# Patient Record
Sex: Male | Born: 1965 | Race: Black or African American | Hispanic: No | Marital: Single
Health system: Midwestern US, Community
[De-identification: ages and names within clinical notes are randomized; demographics above are authoritative.]

## PROBLEM LIST (undated history)

## (undated) DIAGNOSIS — I10 Essential (primary) hypertension: Secondary | ICD-10-CM

## (undated) DIAGNOSIS — J449 Chronic obstructive pulmonary disease, unspecified: Secondary | ICD-10-CM

## (undated) DIAGNOSIS — E785 Hyperlipidemia, unspecified: Secondary | ICD-10-CM

## (undated) DIAGNOSIS — I739 Peripheral vascular disease, unspecified: Secondary | ICD-10-CM

## (undated) DIAGNOSIS — Z86711 Personal history of pulmonary embolism: Secondary | ICD-10-CM

## (undated) DIAGNOSIS — Q2112 Patent foramen ovale: Secondary | ICD-10-CM

## (undated) HISTORY — PX: TOTAL HIP ARTHROPLASTY: SHX124

---

## 2013-06-20 LAB — CBC WITH AUTOMATED DIFF
ABS. BASOPHILS: 0.1 10*3/uL (ref 0.0–0.2)
ABS. EOSINOPHILS: 0.4 10*3/uL (ref 0.0–0.8)
ABS. IMM. GRANS.: 0 10*3/uL (ref 0.0–0.5)
ABS. LYMPHOCYTES: 4.2 10*3/uL (ref 0.5–4.6)
ABS. MONOCYTES: 0.5 10*3/uL (ref 0.1–1.3)
ABS. NEUTROPHILS: 4.7 10*3/uL (ref 1.7–8.2)
BASOPHILS: 1 % (ref 0.0–2.0)
EOSINOPHILS: 5 % (ref 0.5–7.8)
HCT: 46.4 % (ref 41.1–50.3)
HGB: 16.3 g/dL (ref 13.6–17.2)
IMMATURE GRANULOCYTES: 0.2 % (ref 0.0–5.0)
LYMPHOCYTES: 42 % (ref 13–44)
MCH: 31.5 PG (ref 26.1–32.9)
MCHC: 35.1 g/dL — ABNORMAL HIGH (ref 31.4–35.0)
MCV: 89.7 FL (ref 79.6–97.8)
MONOCYTES: 5 % (ref 4.0–12.0)
MPV: 10.2 FL — ABNORMAL LOW (ref 10.8–14.1)
NEUTROPHILS: 47 % (ref 43–78)
PLATELET: 271 10*3/uL (ref 150–450)
RBC: 5.17 M/uL (ref 4.23–5.67)
RDW: 14.3 % (ref 11.9–14.6)
WBC: 9.9 10*3/uL (ref 4.3–11.1)

## 2013-06-20 LAB — METABOLIC PANEL, COMPREHENSIVE
A-G Ratio: 1.2 (ref 1.2–3.5)
ALT (SGPT): 42 U/L (ref 12–65)
AST (SGOT): 41 U/L — ABNORMAL HIGH (ref 15–37)
Albumin: 3.6 g/dL (ref 3.5–5.0)
Alk. phosphatase: 80 U/L (ref 50–136)
Anion gap: 4 mmol/L — ABNORMAL LOW (ref 7–16)
BUN: 21 MG/DL (ref 6–23)
Bilirubin, total: 0.6 MG/DL (ref 0.2–1.1)
CO2: 29 mmol/L (ref 21–32)
Calcium: 8.7 MG/DL (ref 8.3–10.4)
Chloride: 107 mmol/L (ref 98–107)
Creatinine: 1.17 MG/DL (ref 0.8–1.5)
GFR est AA: >60 mL/min/{1.73_m2} (ref >60)
GFR est non-AA: >60 mL/min/{1.73_m2} (ref >60)
Globulin: 2.9 g/dL (ref 2.3–3.5)
Glucose: 96 mg/dL (ref 65–100)
Potassium: 4 mmol/L (ref 3.5–5.1)
Protein, total: 6.5 g/dL (ref 6.3–8.2)
Sodium: 140 mmol/L (ref 136–145)

## 2013-06-20 LAB — BNP: BNP: <2 pg/mL

## 2013-06-20 MED ADMIN — HYDROcodone-acetaminophen (NORCO) 5-325 mg per tablet 1 tablet: ORAL | @ 07:00:00 | NDC 00406012323

## 2013-06-20 NOTE — ED Notes (Signed)
Alerted by loud voices in room.  Arrived to find patient in hallway, loudly verbalizing that "nothing had been done for me. This is fucking bullshit!  You won't even tell me my results."     Primary RN Kemper Durie on scene- advises patient that she attempted to go through his DC when he became belligerent.  Patient swinging arms- nearly contacting my face. Advised patient that any further aggressive motions would necessitate police involvement. Attempted to calm patient to answer any further questions. Patient states "man fuck this, you don't do anything for me." Patient ambulatory from ED in NAD.  Per primary RN, patient would not sign for DC.

## 2013-06-20 NOTE — ED Notes (Signed)
Reports pain, redness and swelling to bilateral lower legs onset about two weeks ago.

## 2013-06-20 NOTE — ED Notes (Signed)
Awoke patient from sleep for discharge.  Pt angry no pain medication prescribed.  Pt states nothing was done for him.  Pt refused to sign discharge instructions and discharge vital signs.

## 2013-06-20 NOTE — ED Provider Notes (Signed)
HPI Comments: 47 yo male with hx of bilateral lower leg swelling for the last 2 weeks. No f/v/d/c/ch. Says the swelling does not go down much at night or with elevation. No known injury. No hx of CHF or renal problems.    Patient is a 47 y.o. male presenting with leg pain. The history is provided by the patient.   Leg Pain          Past Medical History   Diagnosis Date   ??? Chronic obstructive pulmonary disease         History reviewed. No pertinent past surgical history.      History reviewed. No pertinent family history.     History     Social History   ??? Marital Status: SINGLE     Spouse Name: N/A     Number of Children: N/A   ??? Years of Education: N/A     Occupational History   ??? Not on file.     Social History Main Topics   ??? Smoking status: Former Smoker   ??? Smokeless tobacco: Not on file   ??? Alcohol Use: No   ??? Drug Use: No   ??? Sexually Active: Not on file     Other Topics Concern   ??? Not on file     Social History Narrative   ??? No narrative on file                  ALLERGIES: Fish containing products      Review of Systems   Constitutional: Negative.    HENT: Negative.    Respiratory: Negative.    Cardiovascular: Positive for leg swelling.   Gastrointestinal: Negative.    Musculoskeletal: Negative.    Skin: Negative.    Neurological: Negative.        Filed Vitals:    06/20/13 0049   BP: 107/83   Pulse: 87   Temp: 97.9 ??F (36.6 ??C)   Resp: 18   Height: 5\' 11"  (1.803 m)   Weight: 122.471 kg (270 lb)   SpO2: 97%            Physical Exam   Nursing note and vitals reviewed.  Constitutional: He appears well-developed and well-nourished.   Eyes: Conjunctivae are normal. Pupils are equal, round, and reactive to light.   Cardiovascular: Normal rate, regular rhythm and normal heart sounds.    Pulmonary/Chest: Effort normal and breath sounds normal.   Abdominal: Soft. Bowel sounds are normal.   Musculoskeletal: He exhibits edema.   1 edema to mid calf bilaterally   Skin: Skin is warm and dry.        MDM    Procedures

## 2013-09-25 MED ORDER — CEPHALEXIN 500 MG CAP
500 mg | ORAL_CAPSULE | Freq: Four times a day (QID) | ORAL | Status: AC
Start: 2013-09-25 — End: 2013-10-02

## 2013-09-25 MED ORDER — HYDROCODONE-ACETAMINOPHEN 5 MG-325 MG TAB
5-325 mg | ORAL_TABLET | Freq: Two times a day (BID) | ORAL | Status: AC
Start: 2013-09-25 — End: 2013-09-30

## 2013-09-25 MED ORDER — HYDROCODONE-ACETAMINOPHEN 5 MG-325 MG TAB
5-325 mg | ORAL | Status: AC
Start: 2013-09-25 — End: 2013-09-25
  Administered 2013-09-25: 23:00:00 via ORAL

## 2013-09-25 MED ORDER — CEPHALEXIN 500 MG CAP
500 mg | ORAL | Status: AC
Start: 2013-09-25 — End: 2013-09-25
  Administered 2013-09-25: 23:00:00 via ORAL

## 2013-09-25 MED FILL — CEPHALEXIN 500 MG CAP: 500 mg | ORAL | Qty: 1

## 2013-09-25 MED FILL — HYDROCODONE-ACETAMINOPHEN 5 MG-325 MG TAB: 5-325 mg | ORAL | Qty: 1

## 2013-09-25 NOTE — ED Provider Notes (Signed)
Patient is a 48 y.o. male presenting with dental problem. The history is provided by the patient.   Dental Pain   This is a new problem. The current episode started more than 2 days ago. The problem occurs constantly. The problem has been gradually worsening. The pain is located in the right lower mouth.The quality of the pain is aching.  The pain is at a severity of 10/10. The pain is moderate. Associated symptoms include swelling.There was no vomiting and no fever. He has tried acetaminophen for the symptoms. The treatment provided no relief. The patient has no cardiac history.       Past Medical History   Diagnosis Date   ??? Chronic obstructive pulmonary disease         No past surgical history on file.      No family history on file.     History     Social History   ??? Marital Status: SINGLE     Spouse Name: N/A     Number of Children: N/A   ??? Years of Education: N/A     Occupational History   ??? Not on file.     Social History Main Topics   ??? Smoking status: Former Smoker   ??? Smokeless tobacco: Not on file   ??? Alcohol Use: No   ??? Drug Use: No   ??? Sexual Activity: Not on file     Other Topics Concern   ??? Not on file     Social History Narrative   ??? No narrative on file                  ALLERGIES: Fish containing products      Review of Systems   HENT: Positive for dental problem.    All other systems reviewed and are negative.      Filed Vitals:    09/25/13 1633   BP: 154/77   Pulse: 79   Temp: 98.4 ??F (36.9 ??C)   Resp: 18   Height: 5\' 11"  (1.803 m)   Weight: 113.399 kg (250 lb)   SpO2: 98%            Physical Exam   Constitutional: He is oriented to person, place, and time. He appears well-developed and well-nourished. No distress.   HENT:   Head: Normocephalic and atraumatic.   Right Ear: External ear normal.   Left Ear: External ear normal.   Swelling to rt lower jaw, swelling to rt lower gum, no molars remaining    Eyes: Conjunctivae and EOM are normal. Pupils are equal, round, and reactive to light.   Neck:  Normal range of motion. Neck supple.   Cardiovascular: Normal rate and regular rhythm.    Pulmonary/Chest: Effort normal and breath sounds normal. No respiratory distress. He has no wheezes.   Abdominal: Soft. Bowel sounds are normal.   Musculoskeletal: Normal range of motion. He exhibits no edema.   Neurological: He is alert and oriented to person, place, and time.   Skin: Skin is warm.   Nursing note and vitals reviewed.       MDM  Number of Diagnoses or Management Options  Diagnosis management comments: Early dental abscess, will treat        Amount and/or Complexity of Data Reviewed  Review and summarize past medical records: yes    Risk of Complications, Morbidity, and/or Mortality  Presenting problems: low  Diagnostic procedures: low  Management options: low    Patient Progress  Patient progress: improved      Procedures

## 2013-09-25 NOTE — ED Notes (Signed)
Reports right side dental pain.

## 2013-09-25 NOTE — ED Notes (Signed)
I have reviewed discharge instructions with the patient.  The patient verbalized understanding.      Pt given RX for following medications at discharge @MEDDISCHARGE@.  Pt discharged ambulatory from emergency department in no acute distress.

## 2013-12-07 MED ORDER — HYDROCODONE-ACETAMINOPHEN 5 MG-325 MG TAB
5-325 mg | ORAL_TABLET | ORAL | Status: DC | PRN
Start: 2013-12-07 — End: 2014-03-20

## 2013-12-07 MED ORDER — AMOXICILLIN CLAVULANATE 875 MG-125 MG TAB
875-125 mg | ORAL_TABLET | Freq: Two times a day (BID) | ORAL | Status: DC
Start: 2013-12-07 — End: 2014-03-20

## 2013-12-07 MED ORDER — CEFTRIAXONE 1 GRAM SOLUTION FOR INJECTION
1 gram | INTRAMUSCULAR | Status: DC
Start: 2013-12-07 — End: 2013-12-07
  Administered 2013-12-07: 21:00:00 via INTRAMUSCULAR

## 2013-12-07 MED FILL — CEFTRIAXONE 1 GRAM SOLUTION FOR INJECTION: 1 gram | INTRAMUSCULAR | Qty: 1

## 2013-12-07 MED FILL — CEFTRIAXONE 250 MG SOLUTION FOR INJECTION: 250 mg | INTRAMUSCULAR | Qty: 1000

## 2013-12-07 NOTE — ED Notes (Signed)
Discharge instr. And RX x 2 Given and reviewed with pt, questions answered and pt verbalized understanding. Ambulaotry out with out dif.

## 2013-12-07 NOTE — ED Provider Notes (Signed)
HPI Comments: 48 y/o BM with copd presents with right jaw swelling and pain x 1 wk.  Denies fever, n/v, cp, sob, abd pain, changes in bowel or bladder habits or other c/o.  ONG:EXBMPMH:copd. Med:mdi WUX:LKGMall:nkda. Sh/fh:1/2 ppd smoker.      Patient is a 48 y.o. male presenting with dental problem. The history is provided by the patient.   Dental Pain            Past Medical History   Diagnosis Date   ??? Chronic obstructive pulmonary disease (HCC)         History reviewed. No pertinent past surgical history.      History reviewed. No pertinent family history.     History     Social History   ??? Marital Status: SINGLE     Spouse Name: N/A     Number of Children: N/A   ??? Years of Education: N/A     Occupational History   ??? Not on file.     Social History Main Topics   ??? Smoking status: Current Every Day Smoker   ??? Smokeless tobacco: Not on file   ??? Alcohol Use: No   ??? Drug Use: No   ??? Sexual Activity:     Partners: Female     Other Topics Concern   ??? Not on file     Social History Narrative                  ALLERGIES: Fish containing products      Review of Systems   Constitutional: Negative.    HENT: Positive for dental problem.    Eyes: Negative.    Respiratory: Negative.    Cardiovascular: Negative.    Gastrointestinal: Negative.    Endocrine: Negative.    Genitourinary: Negative.    Musculoskeletal: Negative.    Skin: Negative.    Neurological: Negative.    Hematological: Negative.        Filed Vitals:    12/07/13 1623   BP: 143/73   Pulse: 97   Temp: 98.7 ??F (37.1 ??C)   Resp: 18   SpO2: 98%            Physical Exam   Constitutional: He appears well-developed and well-nourished.   HENT:   Head: Normocephalic and atraumatic.   Right Ear: External ear normal.   Left Ear: External ear normal.   Edema and tend right lower jaw/gingiva without fluctuance.  Tend over premolars right lower.  No warmth.  No LA.     Eyes: EOM are normal. Pupils are equal, round, and reactive to light.   Neck: Normal range of motion. Neck supple. No JVD  present. No thyromegaly present.   Cardiovascular: Normal rate, regular rhythm, normal heart sounds and intact distal pulses.    Pulmonary/Chest: Effort normal and breath sounds normal. No respiratory distress. He has no wheezes. He has no rales. He exhibits no tenderness.   Lymphadenopathy:     He has no cervical adenopathy.   Neurological: He is alert.   Skin: Skin is warm and dry.   Psychiatric: His behavior is normal.        MDM    Procedures    Pt refused inferior alveolar nerve block.  States cannot see dentist for 2 wks - advised pt to see dentist asap. Will place on augmentin and f/u with dentist.  Pt voices understanding.

## 2013-12-07 NOTE — ED Notes (Signed)
Right jaw pain dental abscess

## 2014-03-20 MED ORDER — TRAMADOL 50 MG TAB
50 mg | ORAL_TABLET | Freq: Four times a day (QID) | ORAL | Status: AC | PRN
Start: 2014-03-20 — End: ?

## 2014-03-20 NOTE — ED Notes (Signed)
Left knee pain x several months that has worsened.

## 2014-03-20 NOTE — ED Notes (Signed)
Pt given crutches and trained on use.  Pt exhibited use of same.  I have reviewed discharge instructions with the patient.  The patient was given prescriptions.  The patient verbalized understanding of all.  Paper copy of discharge instructions signed.

## 2014-03-20 NOTE — ED Provider Notes (Signed)
HPI Comments: Patient is here with left knee pain that has gotten worse over the last several months.  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  He has not injured it that he is aware of.  No chest pain or shortness of breath.  No swelling or redness of the leg.  No history of blood clots.  His blood pressure is slightly up today but no history of hypertension.    Patient is a 48 y.o. male presenting with knee pain. The history is provided by the patient.   Knee Pain   This is a recurrent problem. Episode onset: A while now.        Past Medical History   Diagnosis Date   ??? Chronic obstructive pulmonary disease (HCC)         History reviewed. No pertinent past surgical history.      History reviewed. No pertinent family history.     History     Social History   ??? Marital Status: SINGLE     Spouse Name: N/A     Number of Children: N/A   ??? Years of Education: N/A     Occupational History   ??? Not on file.     Social History Main Topics   ??? Smoking status: Current Every Day Smoker   ??? Smokeless tobacco: Not on file   ??? Alcohol Use: No   ??? Drug Use: No   ??? Sexual Activity:     Partners: Female     Other Topics Concern   ??? Not on file     Social History Narrative                  ALLERGIES: Fish containing products      Review of Systems   Constitutional: Negative.    HENT: Negative.    Eyes: Negative.    Respiratory: Negative.    Cardiovascular: Negative.    Gastrointestinal: Negative.    Genitourinary: Negative.    Musculoskeletal: Negative.         Left knee pain     Skin: Negative.    Neurological: Negative.    Psychiatric/Behavioral: Negative.    All other systems reviewed and are negative.      Filed Vitals:    03/20/14 0949   BP: 169/116   Pulse: 116   Temp: 98.3 ??F (36.8 ??C)   Resp: 18   Height: 5\' 11"  (1.803 m)   Weight: 120.203 kg (265 lb)   SpO2: 96%            Physical Exam   Constitutional: He is oriented to person, place, and time. He appears well-developed and well-nourished.   HENT:    Head: Normocephalic and atraumatic.   Right Ear: External ear normal.   Left Ear: External ear normal.   Nose: Nose normal.   Mouth/Throat: Oropharynx is clear and moist.   Eyes: Conjunctivae and EOM are normal. Pupils are equal, round, and reactive to light.   Neck: Normal range of motion. Neck supple.   Cardiovascular: Normal rate, regular rhythm, normal heart sounds and intact distal pulses.    Pulmonary/Chest: Effort normal and breath sounds normal.   Abdominal: Soft. Bowel sounds are normal.   Musculoskeletal: Normal range of motion.        Legs:  Neurological: He is alert and oriented to person, place, and time. He has normal reflexes.   Skin: Skin is warm and dry.  Psychiatric: He has a normal mood and affect. His behavior is normal. Judgment and thought content normal.   Nursing note and vitals reviewed.       MDM    Procedures    The patient was observed in the ED.    Results Reviewed:  XR KNEE LT 3 V   Final Result   Left knee 03/20/2014        Indication: Knee pain, worsening over the past several months      Comparison: None       Findings: AP, Lateral, Oblique views are submitted. Bone density is    normal.   There is no fracture, lesion, or acute joint abnormality. There is   tricompartmental degenerative arthritis with joint narrowing and spurring    most   prominent in the medial and patellofemoral compartment. No focal finding    in the   soft tissues.      Impression: No acute finding but there is clearly arthritis changes which    are   moderately pronounced and most severe in the medial compartment.           Rest, ice, elevate, avoid painful activities.  Crutches given today.  Patient is to wear his knee sleeve that he brought with him.  He should call the orthopedics for follow-up.  He was also given a referral to a primary care physician as his blood pressure was slightly elevated today.  He is asked to follow a low-sodium diet and to recheck with them regarding his blood pressure.   I discussed the results of all labs, procedures, radiographs, and treatments with the patient and available family.  Treatment plan is agreed upon and the patient is ready for discharge.  All voiced understanding of the discharge plan and medication instructions or changes as appropriate.  Questions about treatment in the ED were answered.  All were encouraged to return should symptoms worsen or new problems develop.

## 2018-04-13 DIAGNOSIS — M79662 Pain in left lower leg: Secondary | ICD-10-CM | POA: Diagnosis not present

## 2018-04-13 DIAGNOSIS — I82432 Acute embolism and thrombosis of left popliteal vein: Secondary | ICD-10-CM | POA: Diagnosis not present

## 2018-04-13 DIAGNOSIS — N289 Disorder of kidney and ureter, unspecified: Secondary | ICD-10-CM | POA: Diagnosis not present

## 2018-04-13 DIAGNOSIS — M5431 Sciatica, right side: Secondary | ICD-10-CM | POA: Diagnosis not present

## 2018-04-14 DIAGNOSIS — I82432 Acute embolism and thrombosis of left popliteal vein: Secondary | ICD-10-CM | POA: Diagnosis not present

## 2018-04-14 DIAGNOSIS — N289 Disorder of kidney and ureter, unspecified: Secondary | ICD-10-CM | POA: Diagnosis not present

## 2018-04-14 DIAGNOSIS — M5431 Sciatica, right side: Secondary | ICD-10-CM | POA: Diagnosis not present

## 2018-04-29 DIAGNOSIS — J449 Chronic obstructive pulmonary disease, unspecified: Secondary | ICD-10-CM | POA: Diagnosis not present

## 2018-04-29 DIAGNOSIS — I10 Essential (primary) hypertension: Secondary | ICD-10-CM | POA: Diagnosis not present

## 2018-04-29 DIAGNOSIS — I82409 Acute embolism and thrombosis of unspecified deep veins of unspecified lower extremity: Secondary | ICD-10-CM | POA: Diagnosis not present

## 2018-04-29 DIAGNOSIS — M25559 Pain in unspecified hip: Secondary | ICD-10-CM | POA: Diagnosis not present

## 2018-05-30 DIAGNOSIS — I10 Essential (primary) hypertension: Secondary | ICD-10-CM | POA: Diagnosis not present

## 2018-05-30 DIAGNOSIS — M25559 Pain in unspecified hip: Secondary | ICD-10-CM | POA: Diagnosis not present

## 2018-05-30 DIAGNOSIS — E669 Obesity, unspecified: Secondary | ICD-10-CM | POA: Diagnosis not present

## 2018-05-30 DIAGNOSIS — I82409 Acute embolism and thrombosis of unspecified deep veins of unspecified lower extremity: Secondary | ICD-10-CM | POA: Diagnosis not present

## 2018-05-30 DIAGNOSIS — J449 Chronic obstructive pulmonary disease, unspecified: Secondary | ICD-10-CM | POA: Diagnosis not present

## 2018-06-08 DIAGNOSIS — M25551 Pain in right hip: Secondary | ICD-10-CM | POA: Diagnosis not present

## 2018-06-08 DIAGNOSIS — G8929 Other chronic pain: Secondary | ICD-10-CM | POA: Diagnosis not present

## 2018-06-08 DIAGNOSIS — M1610 Unilateral primary osteoarthritis, unspecified hip: Secondary | ICD-10-CM | POA: Diagnosis not present

## 2018-09-30 DIAGNOSIS — I10 Essential (primary) hypertension: Secondary | ICD-10-CM | POA: Diagnosis not present

## 2018-09-30 DIAGNOSIS — I499 Cardiac arrhythmia, unspecified: Secondary | ICD-10-CM | POA: Diagnosis not present

## 2018-09-30 DIAGNOSIS — I739 Peripheral vascular disease, unspecified: Secondary | ICD-10-CM | POA: Diagnosis not present

## 2018-09-30 DIAGNOSIS — R69 Illness, unspecified: Secondary | ICD-10-CM | POA: Diagnosis not present

## 2018-09-30 DIAGNOSIS — J449 Chronic obstructive pulmonary disease, unspecified: Secondary | ICD-10-CM | POA: Diagnosis not present

## 2018-09-30 DIAGNOSIS — Z008 Encounter for other general examination: Secondary | ICD-10-CM | POA: Diagnosis not present

## 2018-09-30 DIAGNOSIS — G8929 Other chronic pain: Secondary | ICD-10-CM | POA: Diagnosis not present

## 2018-09-30 DIAGNOSIS — G629 Polyneuropathy, unspecified: Secondary | ICD-10-CM | POA: Diagnosis not present

## 2018-10-14 DIAGNOSIS — K05319 Chronic periodontitis, localized, unspecified severity: Secondary | ICD-10-CM | POA: Diagnosis not present

## 2018-10-14 DIAGNOSIS — L0201 Cutaneous abscess of face: Secondary | ICD-10-CM | POA: Diagnosis not present

## 2018-10-14 DIAGNOSIS — B9689 Other specified bacterial agents as the cause of diseases classified elsewhere: Secondary | ICD-10-CM | POA: Diagnosis not present

## 2019-06-12 DIAGNOSIS — Z01818 Encounter for other preprocedural examination: Secondary | ICD-10-CM

## 2019-06-24 DIAGNOSIS — Z86718 Personal history of other venous thrombosis and embolism: Secondary | ICD-10-CM | POA: Diagnosis not present

## 2021-04-23 HISTORY — PX: THROMBECTOMY ILIAC ARTERY: SHX6405

## 2021-04-25 HISTORY — PX: FASCIOTOMY CLOSURE: SHX5829

## 2021-10-18 ENCOUNTER — Emergency Department (HOSPITAL_COMMUNITY): Payer: Medicare Other

## 2021-10-18 ENCOUNTER — Other Ambulatory Visit: Payer: Self-pay

## 2021-10-18 ENCOUNTER — Inpatient Hospital Stay (HOSPITAL_COMMUNITY)
Admission: EM | Admit: 2021-10-18 | Discharge: 2021-10-29 | DRG: 871 | Disposition: E | Payer: Medicare Other | Attending: Internal Medicine | Admitting: Internal Medicine

## 2021-10-18 ENCOUNTER — Encounter (HOSPITAL_COMMUNITY): Payer: Self-pay | Admitting: Internal Medicine

## 2021-10-18 DIAGNOSIS — D696 Thrombocytopenia, unspecified: Secondary | ICD-10-CM | POA: Diagnosis present

## 2021-10-18 DIAGNOSIS — E049 Nontoxic goiter, unspecified: Secondary | ICD-10-CM | POA: Diagnosis present

## 2021-10-18 DIAGNOSIS — Z7901 Long term (current) use of anticoagulants: Secondary | ICD-10-CM

## 2021-10-18 DIAGNOSIS — D735 Infarction of spleen: Secondary | ICD-10-CM | POA: Diagnosis present

## 2021-10-18 DIAGNOSIS — G8929 Other chronic pain: Secondary | ICD-10-CM | POA: Diagnosis present

## 2021-10-18 DIAGNOSIS — B9562 Methicillin resistant Staphylococcus aureus infection as the cause of diseases classified elsewhere: Secondary | ICD-10-CM | POA: Diagnosis not present

## 2021-10-18 DIAGNOSIS — G9341 Metabolic encephalopathy: Secondary | ICD-10-CM

## 2021-10-18 DIAGNOSIS — M1612 Unilateral primary osteoarthritis, left hip: Secondary | ICD-10-CM | POA: Diagnosis present

## 2021-10-18 DIAGNOSIS — N179 Acute kidney failure, unspecified: Secondary | ICD-10-CM | POA: Diagnosis not present

## 2021-10-18 DIAGNOSIS — J440 Chronic obstructive pulmonary disease with acute lower respiratory infection: Secondary | ICD-10-CM | POA: Diagnosis not present

## 2021-10-18 DIAGNOSIS — Z86718 Personal history of other venous thrombosis and embolism: Secondary | ICD-10-CM

## 2021-10-18 DIAGNOSIS — J189 Pneumonia, unspecified organism: Secondary | ICD-10-CM | POA: Diagnosis not present

## 2021-10-18 DIAGNOSIS — M25552 Pain in left hip: Secondary | ICD-10-CM

## 2021-10-18 DIAGNOSIS — J9602 Acute respiratory failure with hypercapnia: Secondary | ICD-10-CM | POA: Diagnosis not present

## 2021-10-18 DIAGNOSIS — E872 Acidosis, unspecified: Secondary | ICD-10-CM | POA: Diagnosis present

## 2021-10-18 DIAGNOSIS — Z515 Encounter for palliative care: Secondary | ICD-10-CM | POA: Diagnosis not present

## 2021-10-18 DIAGNOSIS — Z6836 Body mass index (BMI) 36.0-36.9, adult: Secondary | ICD-10-CM

## 2021-10-18 DIAGNOSIS — R6520 Severe sepsis without septic shock: Secondary | ICD-10-CM | POA: Diagnosis present

## 2021-10-18 DIAGNOSIS — Z7982 Long term (current) use of aspirin: Secondary | ICD-10-CM

## 2021-10-18 DIAGNOSIS — E785 Hyperlipidemia, unspecified: Secondary | ICD-10-CM | POA: Diagnosis present

## 2021-10-18 DIAGNOSIS — Z96641 Presence of right artificial hip joint: Secondary | ICD-10-CM | POA: Diagnosis present

## 2021-10-18 DIAGNOSIS — K763 Infarction of liver: Secondary | ICD-10-CM | POA: Diagnosis present

## 2021-10-18 DIAGNOSIS — I10 Essential (primary) hypertension: Secondary | ICD-10-CM | POA: Diagnosis present

## 2021-10-18 DIAGNOSIS — R55 Syncope and collapse: Secondary | ICD-10-CM | POA: Diagnosis present

## 2021-10-18 DIAGNOSIS — A419 Sepsis, unspecified organism: Secondary | ICD-10-CM

## 2021-10-18 DIAGNOSIS — Z808 Family history of malignant neoplasm of other organs or systems: Secondary | ICD-10-CM

## 2021-10-18 DIAGNOSIS — Q2112 Patent foramen ovale: Secondary | ICD-10-CM | POA: Diagnosis not present

## 2021-10-18 DIAGNOSIS — Z20822 Contact with and (suspected) exposure to covid-19: Secondary | ICD-10-CM | POA: Diagnosis present

## 2021-10-18 DIAGNOSIS — E876 Hypokalemia: Secondary | ICD-10-CM | POA: Diagnosis present

## 2021-10-18 DIAGNOSIS — Z79899 Other long term (current) drug therapy: Secondary | ICD-10-CM

## 2021-10-18 DIAGNOSIS — Z86711 Personal history of pulmonary embolism: Secondary | ICD-10-CM | POA: Diagnosis present

## 2021-10-18 DIAGNOSIS — J449 Chronic obstructive pulmonary disease, unspecified: Secondary | ICD-10-CM | POA: Diagnosis present

## 2021-10-18 DIAGNOSIS — R197 Diarrhea, unspecified: Secondary | ICD-10-CM

## 2021-10-18 DIAGNOSIS — N280 Ischemia and infarction of kidney: Secondary | ICD-10-CM | POA: Diagnosis present

## 2021-10-18 DIAGNOSIS — R7881 Bacteremia: Secondary | ICD-10-CM

## 2021-10-18 DIAGNOSIS — E871 Hypo-osmolality and hyponatremia: Secondary | ICD-10-CM | POA: Diagnosis present

## 2021-10-18 DIAGNOSIS — D6959 Other secondary thrombocytopenia: Secondary | ICD-10-CM | POA: Diagnosis present

## 2021-10-18 DIAGNOSIS — N170 Acute kidney failure with tubular necrosis: Secondary | ICD-10-CM | POA: Diagnosis present

## 2021-10-18 DIAGNOSIS — Z87891 Personal history of nicotine dependence: Secondary | ICD-10-CM

## 2021-10-18 DIAGNOSIS — Z66 Do not resuscitate: Secondary | ICD-10-CM | POA: Diagnosis not present

## 2021-10-18 DIAGNOSIS — R6521 Severe sepsis with septic shock: Secondary | ICD-10-CM | POA: Diagnosis not present

## 2021-10-18 DIAGNOSIS — E781 Pure hyperglyceridemia: Secondary | ICD-10-CM | POA: Diagnosis present

## 2021-10-18 DIAGNOSIS — Z7951 Long term (current) use of inhaled steroids: Secondary | ICD-10-CM

## 2021-10-18 DIAGNOSIS — Z8249 Family history of ischemic heart disease and other diseases of the circulatory system: Secondary | ICD-10-CM

## 2021-10-18 DIAGNOSIS — K7200 Acute and subacute hepatic failure without coma: Secondary | ICD-10-CM | POA: Diagnosis not present

## 2021-10-18 DIAGNOSIS — A4102 Sepsis due to Methicillin resistant Staphylococcus aureus: Secondary | ICD-10-CM | POA: Diagnosis present

## 2021-10-18 DIAGNOSIS — W19XXXA Unspecified fall, initial encounter: Principal | ICD-10-CM

## 2021-10-18 DIAGNOSIS — E669 Obesity, unspecified: Secondary | ICD-10-CM | POA: Diagnosis present

## 2021-10-18 DIAGNOSIS — J9601 Acute respiratory failure with hypoxia: Secondary | ICD-10-CM | POA: Diagnosis not present

## 2021-10-18 DIAGNOSIS — R509 Fever, unspecified: Secondary | ICD-10-CM

## 2021-10-18 DIAGNOSIS — I6340 Cerebral infarction due to embolism of unspecified cerebral artery: Secondary | ICD-10-CM | POA: Diagnosis not present

## 2021-10-18 DIAGNOSIS — Z6835 Body mass index (BMI) 35.0-35.9, adult: Secondary | ICD-10-CM

## 2021-10-18 DIAGNOSIS — J9819 Other pulmonary collapse: Secondary | ICD-10-CM | POA: Diagnosis not present

## 2021-10-18 DIAGNOSIS — R9389 Abnormal findings on diagnostic imaging of other specified body structures: Secondary | ICD-10-CM | POA: Diagnosis present

## 2021-10-18 DIAGNOSIS — I739 Peripheral vascular disease, unspecified: Secondary | ICD-10-CM | POA: Diagnosis present

## 2021-10-18 HISTORY — DX: Personal history of pulmonary embolism: Z86.711

## 2021-10-18 HISTORY — DX: Patent foramen ovale: Q21.12

## 2021-10-18 HISTORY — DX: Essential (primary) hypertension: I10

## 2021-10-18 HISTORY — DX: Chronic obstructive pulmonary disease, unspecified: J44.9

## 2021-10-18 HISTORY — DX: Peripheral vascular disease, unspecified: I73.9

## 2021-10-18 HISTORY — DX: Hyperlipidemia, unspecified: E78.5

## 2021-10-18 LAB — TSH: TSH: 1.678 u[IU]/mL (ref 0.350–4.500)

## 2021-10-18 LAB — COMPREHENSIVE METABOLIC PANEL
ALT: 42 U/L (ref 0–44)
AST: 76 U/L — ABNORMAL HIGH (ref 15–41)
Albumin: 3.2 g/dL — ABNORMAL LOW (ref 3.5–5.0)
Alkaline Phosphatase: 61 U/L (ref 38–126)
Anion gap: 16 — ABNORMAL HIGH (ref 5–15)
BUN: 16 mg/dL (ref 6–20)
CO2: 22 mmol/L (ref 22–32)
Calcium: 8.5 mg/dL — ABNORMAL LOW (ref 8.9–10.3)
Chloride: 96 mmol/L — ABNORMAL LOW (ref 98–111)
Creatinine, Ser: 1.39 mg/dL — ABNORMAL HIGH (ref 0.61–1.24)
GFR, Estimated: 59 mL/min — ABNORMAL LOW (ref >60)
Glucose, Bld: 107 mg/dL — ABNORMAL HIGH (ref 70–99)
Potassium: 3.2 mmol/L — ABNORMAL LOW (ref 3.5–5.1)
Sodium: 134 mmol/L — ABNORMAL LOW (ref 135–145)
Total Bilirubin: 2 mg/dL — ABNORMAL HIGH (ref 0.3–1.2)
Total Protein: 6.6 g/dL (ref 6.5–8.1)

## 2021-10-18 LAB — URINALYSIS, ROUTINE W REFLEX MICROSCOPIC
Bilirubin Urine: NEGATIVE
Glucose, UA: NEGATIVE mg/dL
Ketones, ur: NEGATIVE mg/dL
Leukocytes,Ua: NEGATIVE
Nitrite: NEGATIVE
Protein, ur: 100 mg/dL — AB
Specific Gravity, Urine: 1.019 (ref 1.005–1.030)
pH: 5 (ref 5.0–8.0)

## 2021-10-18 LAB — CBC WITH DIFFERENTIAL/PLATELET
Abs Immature Granulocytes: 0 10*3/uL (ref 0.00–0.07)
Band Neutrophils: 22 %
Basophils Absolute: 0 10*3/uL (ref 0.0–0.1)
Basophils Relative: 0 %
Eosinophils Absolute: 0 10*3/uL (ref 0.0–0.5)
Eosinophils Relative: 0 %
HCT: 51.1 % (ref 39.0–52.0)
Hemoglobin: 16.8 g/dL (ref 13.0–17.0)
Lymphocytes Relative: 3 %
Lymphs Abs: 0.6 10*3/uL — ABNORMAL LOW (ref 0.7–4.0)
MCH: 28.6 pg (ref 26.0–34.0)
MCHC: 32.9 g/dL (ref 30.0–36.0)
MCV: 87.1 fL (ref 80.0–100.0)
Monocytes Absolute: 0 10*3/uL — ABNORMAL LOW (ref 0.1–1.0)
Monocytes Relative: 0 %
Neutro Abs: 18.2 10*3/uL — ABNORMAL HIGH (ref 1.7–7.7)
Neutrophils Relative %: 75 %
Platelets: 96 10*3/uL — ABNORMAL LOW (ref 150–400)
RBC: 5.87 MIL/uL — ABNORMAL HIGH (ref 4.22–5.81)
RDW: 18.6 % — ABNORMAL HIGH (ref 11.5–15.5)
Smear Review: DECREASED
WBC Morphology: INCREASED
WBC: 18.8 10*3/uL — ABNORMAL HIGH (ref 4.0–10.5)
nRBC: 0 % (ref 0.0–0.2)

## 2021-10-18 LAB — PROTIME-INR
INR: 1.5 — ABNORMAL HIGH (ref 0.8–1.2)
Prothrombin Time: 17.8 seconds — ABNORMAL HIGH (ref 11.4–15.2)

## 2021-10-18 LAB — CBG MONITORING, ED
Glucose-Capillary: 122 mg/dL — ABNORMAL HIGH (ref 70–99)
Glucose-Capillary: 113 mg/dL — ABNORMAL HIGH (ref 70–99)

## 2021-10-18 LAB — RESP PANEL BY RT-PCR (FLU A&B, COVID) ARPGX2
Influenza A by PCR: NEGATIVE
Influenza B by PCR: NEGATIVE
SARS Coronavirus 2 by RT PCR: NEGATIVE

## 2021-10-18 LAB — LACTIC ACID, PLASMA
Lactic Acid, Venous: 3.3 mmol/L (ref 0.5–1.9)
Lactic Acid, Venous: 3.2 mmol/L (ref 0.5–1.9)

## 2021-10-18 MED ORDER — VANCOMYCIN HCL 1500 MG/300ML IV SOLN
1500.0000 mg | INTRAVENOUS | Status: DC
  Administered 2021-10-19: 1500 mg via INTRAVENOUS
  Filled 2021-10-18: qty 300

## 2021-10-18 MED ORDER — SODIUM CHLORIDE 0.9 % IV SOLN: INTRAVENOUS | Status: AC

## 2021-10-18 MED ORDER — VANCOMYCIN HCL 1750 MG/350ML IV SOLN
1750.0000 mg | Freq: Once | INTRAVENOUS | Status: AC
  Administered 2021-10-19: 1750 mg via INTRAVENOUS
  Filled 2021-10-18 (×2): qty 350

## 2021-10-18 MED ORDER — OXYCODONE HCL 5 MG PO TABS
5.0000 mg | ORAL_TABLET | ORAL | Status: DC | PRN
  Administered 2021-10-19: 5 mg via ORAL
  Filled 2021-10-18: qty 1

## 2021-10-18 MED ORDER — SODIUM CHLORIDE 0.9 % IV BOLUS
1000.0000 mL | Freq: Once | INTRAVENOUS | Status: AC
  Administered 2021-10-18: 1000 mL via INTRAVENOUS

## 2021-10-18 MED ORDER — ONDANSETRON HCL 4 MG PO TABS: 4.0000 mg | ORAL_TABLET | Freq: Four times a day (QID) | ORAL | Status: DC | PRN

## 2021-10-18 MED ORDER — ONDANSETRON HCL 4 MG/2ML IJ SOLN
4.0000 mg | Freq: Four times a day (QID) | INTRAMUSCULAR | Status: DC | PRN
  Administered 2021-10-19: 4 mg via INTRAVENOUS
  Filled 2021-10-18: qty 2

## 2021-10-18 MED ORDER — MORPHINE SULFATE (PF) 4 MG/ML IV SOLN
4.0000 mg | Freq: Once | INTRAVENOUS | Status: AC
  Administered 2021-10-18: 4 mg via INTRAVENOUS
  Filled 2021-10-18: qty 1

## 2021-10-18 MED ORDER — SODIUM CHLORIDE 0.9% FLUSH
3.0000 mL | Freq: Two times a day (BID) | INTRAVENOUS | Status: DC
  Administered 2021-10-19 – 2021-10-24 (×9): 3 mL via INTRAVENOUS

## 2021-10-18 MED ORDER — IOHEXOL 300 MG/ML  SOLN
100.0000 mL | Freq: Once | INTRAMUSCULAR | Status: AC | PRN
  Administered 2021-10-18: 100 mL via INTRAVENOUS

## 2021-10-18 MED ORDER — SODIUM CHLORIDE 0.9 % IV SOLN
2.0000 g | Freq: Three times a day (TID) | INTRAVENOUS | Status: DC
  Administered 2021-10-19 (×2): 2 g via INTRAVENOUS
  Filled 2021-10-18 (×2): qty 2

## 2021-10-18 MED ORDER — ACETAMINOPHEN 325 MG PO TABS
650.0000 mg | ORAL_TABLET | Freq: Four times a day (QID) | ORAL | Status: DC | PRN
  Administered 2021-10-19 – 2021-10-24 (×11): 650 mg via ORAL
  Filled 2021-10-18 (×12): qty 2

## 2021-10-18 MED ORDER — HYDROMORPHONE HCL 1 MG/ML IJ SOLN
1.0000 mg | Freq: Once | INTRAMUSCULAR | Status: AC
  Administered 2021-10-18: 1 mg via INTRAVENOUS
  Filled 2021-10-18: qty 1

## 2021-10-18 MED ORDER — SODIUM CHLORIDE 0.9 % IV SOLN: 2.0000 g | Freq: Once | INTRAVENOUS | Status: DC

## 2021-10-18 MED ORDER — HYDROMORPHONE HCL 1 MG/ML IJ SOLN
1.0000 mg | INTRAMUSCULAR | Status: AC | PRN
  Administered 2021-10-19 (×3): 1 mg via INTRAVENOUS
  Filled 2021-10-18 (×3): qty 1

## 2021-10-18 MED ORDER — POTASSIUM CHLORIDE 20 MEQ PO PACK
40.0000 meq | PACK | Freq: Once | ORAL | Status: AC
  Administered 2021-10-19: 40 meq via ORAL
  Filled 2021-10-18: qty 2

## 2021-10-18 MED ORDER — HEPARIN (PORCINE) 25000 UT/250ML-% IV SOLN
2050.0000 [IU]/h | INTRAVENOUS | Status: DC
Start: 2021-10-18 — End: 2021-10-20
  Administered 2021-10-19: 1700 [IU]/h via INTRAVENOUS
  Administered 2021-10-19: 1600 [IU]/h via INTRAVENOUS
  Filled 2021-10-18 (×4): qty 250

## 2021-10-18 MED ORDER — ACETAMINOPHEN 650 MG RE SUPP: 650.0000 mg | Freq: Four times a day (QID) | RECTAL | Status: DC | PRN

## 2021-10-18 MED ORDER — ACETAMINOPHEN 500 MG PO TABS
1000.0000 mg | ORAL_TABLET | Freq: Once | ORAL | Status: AC
  Administered 2021-10-18: 1000 mg via ORAL
  Filled 2021-10-18: qty 2

## 2021-10-18 MED ORDER — METRONIDAZOLE 500 MG/100ML IV SOLN
500.0000 mg | Freq: Two times a day (BID) | INTRAVENOUS | Status: DC
  Administered 2021-10-19: 500 mg via INTRAVENOUS
  Filled 2021-10-18: qty 100

## 2021-10-18 NOTE — Progress Notes (Signed)
Pharmacy Antibiotic Note ? ?Jeffrey Goodwin is a 56 y.o. male admitted on 2021-11-10 with  unknown source .  Pharmacy has been consulted for vancomycin and cefepime dosing. Lactic acid 3.2. WBC elevated and patient is tachycardic. ? ?Plan: ?Start cefepime 2g every 8 hours ?Give vancomycin 1750 mg followed by 1500 mg every 24 hours for eAUC of 484 using 0.5L VD ?Follow up clinical progress and deescalate as indicated ? ?Height: 5\' 10"  (177.8 cm) ?Weight: 113.4 kg (250 lb) ?IBW/kg (Calculated) : 73 ? ?Temp (24hrs), Avg:99.5 ?F (37.5 ?C), Min:98.2 ?F (36.8 ?C), Max:100.7 ?F (38.2 ?C) ? ?Recent Labs  ?Lab 11/10/21 ?1700 Nov 10, 2021 ?1901  ?WBC 18.8*  --   ?CREATININE 1.39*  --   ?LATICACIDVEN 3.3* 3.2*  ?  ?Estimated Creatinine Clearance: 74 mL/min (A) (by C-G formula based on SCr of 1.39 mg/dL (H)).   ? ?No Known Allergies ? ?Microbiology results: ?3/21 BCx: IP ? ?Thank you for allowing pharmacy to participate in this patient's care. ? ?4/21, PharmD ?PGY1 Pharmacy Resident ?November 10, 2021 11:26 PM ?Check [email or web address] for unit specific pharmacy number ? ? ?

## 2021-10-18 NOTE — ED Notes (Signed)
Patient transported to CT with RN 

## 2021-10-18 NOTE — H&P (Signed)
?History and Physical  ? ? ?Jeffrey Goodwin WUJ:[id number] DOB: 09-Feb-1966 DOA: 10/20/2021 ? ?PCP: System, Provider Not In  ?Patient coming from: Home via EMS ? ?I have personally briefly reviewed patient's old medical records in [place] ? ?Chief Complaint: Near syncope, fevers, diarrhea ? ?HPI: ?Jeffrey Goodwin is a 56 y.o. male with medical history significant for PVD w/ hx of acute limb ischemia s/p RLE embolectomy and fasciotomy (03/2021), COPD, history of DVT/PE on Xarelto, HTN, HLD, severe left hip osteoarthritis, PFO seen on bubble study surface echo who presented to the ED for evaluation of near syncope with fall. ? ?Patient reports 2 days of nausea, vomiting, diarrhea, subjective fevers, chills, diaphoresis.  He has not seen any blood or dark black color stool.  He says today (3/21) he stood up to go to the bathroom when he became lightheaded/dizzy and fell to the ground.  He says he landed on his left hip which caused worsening of his chronic severe left hip osteoarthritis pain.  He states that he did not lose consciousness.  He denied any associated dyspnea, abdominal pain, dysuria, focal weakness.  He denies any sick contacts. ? ?ED Course  Labs/Imaging on admission: I have personally reviewed following labs and imaging studies. ? ?Initial vitals showed BP 132/110, pulse 114, RR 31, temp 100.7 ?F, SPO2 98% on room air. ? ?Labs showed WBC 18.8, hemoglobin 16.8, platelets 96,000, sodium 134, potassium 3.2, bicarb 22, BUN 16, creatinine 1.39 (previously 0.82 on 05/16/2021), serum glucose 107, AST 76, ALT 42, alk phos 61, total bilirubin 2.0, INR 1.5, lactic acid 3.3 Granocyte 3.2, TSH 1.678. ? ?Urinalysis shows negative nitrites, negative leukocytes, 0-5 RBCs and WBC/hpf, rare bacteria on microscopy.  Blood cultures ordered and pending.  SARS-CoV-2 and influenza PCR negative.  C. difficile and GI pathogen panels ordered and pending collection. ? ?Formal chest x-ray negative for focal  consolidation, edema, effusion. ? ?Pelvic x-ray shows severe degenerative joint disease of the left hip without acute abnormality. ? ?CT head without contrast negative for acute endocrine abnormality.  CT cervical spine without contrast negative for evidence of acute fracture or traumatic malalignment.  Multilevel degenerative changes noted.  Enlarged and heterogeneous thyroid seen. ? ?CT left hip without contrast shows unchanged severe left hip osteoarthritis without acute osseous abnormality. ? ?CT chest/abdomen/pelvis with contrast negative for evidence of acute traumatic injury.  New ill-defined hypodensity in the superior right liver is seen and favored to be perfusional and less likely laceration/contusion per radiology read.  New wedge-shaped hypodensity upper pole of left kidney seen concerning for renal infarct.  New focal ill-defined hypodensity in the peripheral inferior spleen may be perfusional versus small splenic infarct. ? ?Patient was given 2 L normal saline followed by maintenance fluids.  He was given IV Dilaudid and morphine.  The hospitalist service was consulted to admit for further evaluation and management. ? ?Review of Systems: All systems reviewed and are negative except as documented in history of present illness above. ? ? ?Past Medical History:  ?Diagnosis Date  ? COPD (chronic obstructive pulmonary disease) ([place])   ? History of pulmonary embolus (PE)   ? Hyperlipidemia   ? Hypertension   ? PFO (patent foramen ovale)   ? PVD (peripheral vascular disease) ([place])   ? ? ?Past Surgical History:  ?Procedure Laterality Date  ? FASCIOTOMY CLOSURE Right 04/25/2021  ? THROMBECTOMY ILIAC ARTERY Bilateral 04/23/2021  ? TOTAL HIP ARTHROPLASTY Right   ? ? ?Social History: ? reports that he  has quit smoking. His smoking use included cigarettes. He does not have any smokeless tobacco history on file. He reports that he does not currently use alcohol. He reports that he does not currently use  drugs. ? ?No Known Allergies ? ?Family History  ?Problem Relation Age of Onset  ? Hypertension Mother   ? Hypertension Father   ? Throat cancer Father   ? ? ? ?Prior to Admission medications   ?Medication Sig Start Date End Date Taking? Authorizing Provider  ?albuterol (VENTOLIN HFA) 108 (90 Base) MCG/ACT inhaler Inhale 1 puff into the lungs every 6 (six) hours as needed for wheezing or shortness of breath. 10/04/21  Yes [provider]  ?aspirin 81 MG EC tablet Take 81 mg by mouth daily. 05/03/21  Yes [provider]  ?atorvastatin (LIPITOR) 10 MG tablet Take 10 mg by mouth at bedtime. 08/05/21  Yes [provider]  ?diclofenac Sodium (VOLTAREN) 1 % GEL Apply 2 g topically 4 (four) times daily as needed (left hip). 10/04/21  Yes [provider]  ?gabapentin (NEURONTIN) 600 MG tablet Take 600 mg by mouth 3 (three) times daily. 08/05/21  Yes [provider]  ?lisinopril (ZESTRIL) 10 MG tablet Take 10 mg by mouth daily. 08/05/21  Yes [provider]  ?methocarbamol (ROBAXIN) 750 MG tablet Take 750 mg by mouth 3 (three) times daily as needed for muscle spasms. 08/05/21  Yes [provider]  ?naloxone (NARCAN) nasal spray 4 mg/0.1 mL Place 1 spray into the nose as directed. 06/06/21  Yes [provider]  ?oxyCODONE (ROXICODONE) 15 MG immediate release tablet Take 15 mg by mouth 3 (three) times daily as needed for pain. 10/04/21  Yes [provider]  ?tamsulosin (FLOMAX) 0.4 MG CAPS capsule Take 0.4 mg by mouth daily. 08/05/21  Yes [provider]  ?trazodone (DESYREL) 300 MG tablet Take 300 mg by mouth at bedtime. 08/05/21  Yes [provider]  ?Donnal Debar 200-62.5-25 MCG/ACT AEPB Take 1 puff by mouth daily. 10/04/21  Yes [provider]  ?XARELTO 20 MG TABS tablet Take 20 mg by mouth every evening. 10/04/21  Yes [provider]  ? ? ?Physical Exam: ?Vitals:  ? 10/20/2021 2145 10/02/2021 2230 09/29/2021 2349 10/12/2021 2352  ?BP:  106/78 (!) 143/87 (!) 169/99 (!) 118/93  ?Pulse: (!) 106 (!) 109 (!) 110 (!) 111  ?Resp: (!) 22 19 (!) 22 20  ?Temp:   (!) 97.4 ?F (36.3 ?C) (!) 97.4 ?F (36.3 ?C)  ?TempSrc:   Oral Oral  ?SpO2: 95% 96% 92% 93%  ?Weight:      ?Height:      ? ?Constitutional: Obese man resting in bed with head elevated, somewhat uncomfortable ?Eyes: PERRL, lids and conjunctivae normal ?ENMT: Mucous membranes are dry. Posterior pharynx clear of any exudate or lesions.poor dentition.  ?Neck: normal, supple, no masses. ?Respiratory: clear to auscultation bilaterally, no wheezing, no crackles. Normal respiratory effort. No accessory muscle use.  ?Cardiovascular: Tachycardic with regular rhythm, no murmurs / rubs / gallops. No extremity edema. 2+ pedal pulses on the left, 1+ on the right. ?Abdomen: no tenderness, no masses palpated. No hepatosplenomegaly.  ?Musculoskeletal: no clubbing / cyanosis. No joint deformity upper and lower extremities. Good ROM, no contractures. Normal muscle tone.  ?Skin: Well-healed surgical scar right lower extremity.  No rashes, lesions, ulcers. No induration ?Neurologic: CN 2-12 grossly intact. Sensation intact. Strength 5/5 in all 4.  ?Psychiatric: Normal judgment and insight. Alert and oriented x 3. Normal mood.  ? ?  EKG: Personally reviewed. Sinus tachycardia, rate 112, LAE, RVH.  No prior for comparison. ? ?Assessment/Plan ?Principal Problem: ?  Severe sepsis with lactic acidosis ([place]) ?Active Problems: ?  Abnormal CT scan ?  Near syncope ?  PVD (peripheral vascular disease) ([place]) ?  AKI (acute kidney injury) ([place]) ?  History of pulmonary embolus (PE) ?  Thrombocytopenia ([place]) ?  COPD (chronic obstructive pulmonary disease) ([place]) ?  Hyperlipidemia ?  Hypertension ?  PFO (patent foramen ovale) ?  Enlarged thyroid ?  Osteoarthritis of left hip ?  ?Dael Howland is a 56 y.o. male with medical history significant for PVD w/ hx of acute limb ischemia s/p RLE embolectomy and fasciotomy (03/2021), COPD,  history of DVT/PE on Xarelto, HTN, HLD, severe left hip osteoarthritis, PFO seen on bubble study surface echo who is admitted with severe sepsis. ? ?Assessment and Plan: ?* Severe sepsis with lactic acidosis ([place]) ?Pati

## 2021-10-18 NOTE — Progress Notes (Signed)
ANTICOAGULATION CONSULT NOTE - Initial Consult ? 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 CT with possible renal and splenic infarcts. New thrombocytopenia. ? ?No Known Allergies ? ?Patient Measurements: ?Height: 5\' 10"  (177.8 cm) ?Weight: 113.4 kg (250 lb) ?IBW/kg (Calculated) : 73 ?Heparin Dosing Weight: 97.9 ? ?Vital Signs: ?Temp: 98.2 ?F (36.8 ?C) (03/21 2115) ?Temp Source: Oral (03/21 2115) ?BP: 106/78 (03/21 2145) ?Pulse Rate: 106 (03/21 2145) ? ?Labs: ?Recent Labs  ?  10/28/2021 ?1700  ?HGB 16.8  ?HCT 51.1  ?PLT 96*  ?LABPROT 17.8*  ?INR 1.5*  ?CREATININE 1.39*  ? ? ?Estimated Creatinine Clearance: 74 mL/min (A) (by C-G formula based on SCr of 1.39 mg/dL (H)). ? ? ?Medical History: ?Past Medical History:  ?Diagnosis Date  ? COPD (chronic obstructive pulmonary disease) ([place])   ? History of pulmonary embolus (PE)   ? Hyperlipidemia   ? Hypertension   ? PFO (patent foramen ovale)   ? PVD (peripheral vascular disease) ([place])   ? ? ?Medications:  ?(Not in a hospital admission) ? ?Scheduled:  ? potassium chloride  40 mEq Oral Once  ? sodium chloride flush  3 mL Intravenous Q12H  ? ?Infusions:  ? sodium chloride 125 mL/hr at 10/27/2021 1832  ? ceFEPime (MAXIPIME) IV    ? heparin    ? metronidazole    ? vancomycin    ? ?PRN: acetaminophen **OR** acetaminophen, HYDROmorphone (DILAUDID) injection, ondansetron **OR** ondansetron (ZOFRAN) IV, oxyCODONE ? ?Assessment: ?48 yom with a history of PVD w/ hx of acute limb ischemia s/p RLE embolectomy and fasciotomy (03/2021), COPD, history of DVT/PE on Xarelto, HTN, HLD, severe left hip osteoarthritis, PFO . Patient is presenting with Near syncope, fevers, diarrhea. 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 CT with possible renal and splenic infarcts. New thrombocytopenia. . ? ?Patient is on Xarelto prior to arrival. Last dose 3/20 1800. Will require aPTT monitoring due to likely falsely high anti-Xa level secondary to DOAC  use. ? ?Hgb 16.8; plt 96 ? ?Goal of Therapy:  ?Heparin level 0.3-0.7 units/ml ?aPTT 66-102 seconds ?Monitor platelets by anticoagulation protocol: Yes ?  ?Plan:  ?No initial heparin bolus ?Start heparin infusion at 1600 units/hr ?Check aPTT & anti-Xa level in now, in 6 hours, and daily while on heparin ?Continue to monitor via aPTT until levels are correlated ?Continue to monitor H&H and platelets ? ?Lorelei Pont, PharmD, BCPS ?10/09/2021 11:18 PM ?ED Clinical Pharmacist -  [phone] ? ? ? ?

## 2021-10-18 NOTE — Assessment & Plan Note (Addendum)
Possible left renal infarct ?Possible small splenic infarct ?Ill-defined hypodensity superior right liver ?CT imaging with changes suggestive of left renal infarct, possible small splenic infarct versus perfusional change, and presumed perfusional right liver hypodensity versus hepatic injury.  He has not been having any significant abdominal pain.  He has been on Xarelto for history of DVT/PE. ?-Hold Xarelto ?-Start on IV heparin per pharmacy without bolus given thrombocytopenia ?

## 2021-10-18 NOTE — Progress Notes (Signed)
Chaplain coming on call assumed this level II fall.  Patient was waiting for CT scan.  Chaplain engaged patient at the bedside and was able to facilitate a call to his sister Redd to make sure she was aware.  She appreciated the call and not able to come right now due to work.  Patient states he and his sister are close.  Medical team continues to evaluate.  Chaplain provided ministry of presence and words of comfort and support.  Chaplain available as needed. ?Chaplain Agustin Cree, [place]. ? ? ? 10/03/2021 1732  ?Clinical Encounter Type  ?Visited With Patient;Health care provider  ?Visit Type Initial;ED;Trauma  ?Referral From Chaplain  ?Consult/Referral To Chaplain  ? ? ?

## 2021-10-18 NOTE — ED Provider Notes (Signed)
?[facility] ?Provider Note ? ? ?CSN: [id number] ?Arrival date & time: October 28, 2021  1645 ? ?  ? ?History ? ?Chief Complaint  ?Patient presents with  ? Fall  ? ? ?Jeffrey Goodwin is a 56 y.o. male. ? ?Pt is a 56 yo male with a hx of PVD, copd, dvt, htn, hyperlipidemia, arthritis, PE on Xarelto.  Pt said he has been feeling very dizzy and weak today.  He has fallen 3 times.  The last fall, he landed on his left hip and has a lot of pain to the hip.  He also hit his head.  ? Loc. He has had diarrhea.  Pt was given 100 mcg fentanyl and 4 mg zofran en route by EMS.  Pt denies any sob.  He was unaware he had a fever. ? ? ?  ? ?Home Medications ?Prior to Admission medications   ?Medication Sig Start Date End Date Taking? Authorizing Provider  ?albuterol (VENTOLIN HFA) 108 (90 Base) MCG/ACT inhaler Inhale 1 puff into the lungs every 6 (six) hours as needed for wheezing or shortness of breath. 10/04/21  Yes [provider]  ?aspirin 81 MG EC tablet Take 81 mg by mouth daily. 05/03/21  Yes [provider]  ?atorvastatin (LIPITOR) 10 MG tablet Take 10 mg by mouth at bedtime. 08/05/21  Yes [provider]  ?diclofenac Sodium (VOLTAREN) 1 % GEL Apply 2 g topically 4 (four) times daily as needed (left hip). 10/04/21  Yes [provider]  ?gabapentin (NEURONTIN) 600 MG tablet Take 600 mg by mouth 3 (three) times daily. 08/05/21  Yes [provider]  ?lisinopril (ZESTRIL) 10 MG tablet Take 10 mg by mouth daily. 08/05/21  Yes [provider]  ?methocarbamol (ROBAXIN) 750 MG tablet Take 750 mg by mouth 3 (three) times daily as needed for muscle spasms. 08/05/21  Yes [provider]  ?naloxone (NARCAN) nasal spray 4 mg/0.1 mL Place 1 spray into the nose as directed. 06/06/21  Yes [provider]  ?oxyCODONE (ROXICODONE) 15 MG immediate release tablet Take 15 mg by mouth 3 (three) times daily as needed for pain. 10/04/21  Yes [provider]  ?tamsulosin (FLOMAX) 0.4 MG CAPS capsule Take 0.4 mg by mouth daily. 08/05/21  Yes [provider]  ?trazodone (DESYREL) 300 MG tablet Take 300 mg by mouth at bedtime. 08/05/21  Yes [provider]  ?[name] 200-62.5-25 MCG/ACT AEPB Take 1 puff by mouth daily. 10/04/21  Yes [provider]  ?XARELTO 20 MG TABS tablet Take 20 mg by mouth every evening. 10/04/21  Yes [provider]  ?   ? ?Allergies    ?Patient has no known allergies.   ? ?Review of Systems   ?Review of Systems  ?Musculoskeletal:   ?     Left hip pain  ?Neurological:  Positive for headaches.  ?All other systems reviewed and are negative. ? ?Physical Exam ?Updated Vital Signs ?BP (!) 143/87   Pulse (!) 109   Temp 98.2 ?F (36.8 ?C) (Oral)   Resp 19   Ht 5\' 10"  (1.778 m)   Wt 113.4 kg   SpO2 96%   BMI 35.87 kg/m?  ?Physical Exam ?Vitals and nursing note reviewed.  ?Constitutional:   ?   Appearance: Normal appearance.  ?HENT:  ?   Head: Normocephalic and atraumatic.  ?   Right Ear: External ear normal.  ?   Left Ear: External ear normal.  ?   Nose: Nose  normal.  ?   Mouth/Throat:  ?   Mouth: Mucous membranes are dry.  ?Eyes:  ?   Extraocular Movements: Extraocular movements intact.  ?   Conjunctiva/sclera: Conjunctivae normal.  ?   Pupils: Pupils are equal, round, and reactive to light.  ?Neck:  ?   Comments: In c-collar ?Cardiovascular:  ?   Rate and Rhythm: Regular rhythm. Tachycardia present.  ?   Pulses: Normal pulses.  ?   Heart sounds: Normal heart sounds.  ?Pulmonary:  ?   Effort: Pulmonary effort is normal.  ?   Breath sounds: Normal breath sounds.  ?Abdominal:  ?   General: Abdomen is flat. Bowel sounds are normal.  ?   Palpations: Abdomen is soft.  ?Musculoskeletal:  ?     Legs: ? ?Skin: ?   General: Skin is warm.  ?   Capillary Refill: Capillary refill takes less than 2 seconds.  ?Neurological:  ?   General: No focal deficit present.  ?   Mental Status: He is alert and oriented to  person, place, and time.  ?Psychiatric:     ?   Mood and Affect: Mood normal.     ?   Behavior: Behavior normal.  ? ? ?ED Results / Procedures / Treatments   ?Labs ?(all labs ordered are listed, but only abnormal results are displayed) ?Labs Reviewed  ?CBC WITH DIFFERENTIAL/PLATELET - Abnormal; Notable for the following components:  ?    Result Value  ? WBC 18.8 (*)   ? RBC 5.87 (*)   ? RDW 18.6 (*)   ? Platelets 96 (*)   ? Neutro Abs 18.2 (*)   ? Lymphs Abs 0.6 (*)   ? Monocytes Absolute 0.0 (*)   ? All other components within normal limits  ?COMPREHENSIVE METABOLIC PANEL - Abnormal; Notable for the following components:  ? Sodium 134 (*)   ? Potassium 3.2 (*)   ? Chloride 96 (*)   ? Glucose, Bld 107 (*)   ? Creatinine, Ser 1.39 (*)   ? Calcium 8.5 (*)   ? Albumin 3.2 (*)   ? AST 76 (*)   ? Total Bilirubin 2.0 (*)   ? GFR, Estimated 59 (*)   ? Anion gap 16 (*)   ? All other components within normal limits  ?PROTIME-INR - Abnormal; Notable for the following components:  ? Prothrombin Time 17.8 (*)   ? INR 1.5 (*)   ? All other components within normal limits  ?URINALYSIS, ROUTINE W REFLEX MICROSCOPIC - Abnormal; Notable for the following components:  ? Color, Urine AMBER (*)   ? APPearance HAZY (*)   ? Hgb urine dipstick LARGE (*)   ? Protein, ur 100 (*)   ? Bacteria, UA RARE (*)   ? All other components within normal limits  ?LACTIC ACID, PLASMA - Abnormal; Notable for the following components:  ? Lactic Acid, Venous 3.3 (*)   ? All other components within normal limits  ?LACTIC ACID, PLASMA - Abnormal; Notable for the following components:  ? Lactic Acid, Venous 3.2 (*)   ? All other components within normal limits  ?CBG MONITORING, ED - Abnormal; Notable for the following components:  ? Glucose-Capillary 122 (*)   ? All other components within normal limits  ?CBG MONITORING, ED - Abnormal; Notable for the following components:  ? Glucose-Capillary 113 (*)   ? All other components within normal limits  ?RESP PANEL  BY RT-PCR (FLU A&B, COVID) [id number]  ?CULTURE, BLOOD (ROUTINE X 2)  ?  CULTURE, BLOOD (ROUTINE X 2)  ?GASTROINTESTINAL PANEL BY PCR, STOOL (REPLACES STOOL CULTURE)  ?C DIFFICILE QUICK SCREEN W PCR REFLEX    ?TSH  ?HIV ANTIBODY (ROUTINE TESTING W REFLEX)  ?PROTIME-INR  ?PROCALCITONIN  ?COMPREHENSIVE METABOLIC PANEL  ?CBC  ?MAGNESIUM  ?HEPARIN LEVEL (UNFRACTIONATED)  ?HEPARIN LEVEL (UNFRACTIONATED)  ?APTT  ?APTT  ? ? ?EKG ?EKG Interpretation ? ?Date/Time:  Tuesday October 18 2021 16:51:23 EDT ?Ventricular Rate:  112 ?PR Interval:  165 ?QRS Duration: 100 ?QT Interval:  312 ?QTC Calculation: 426 ?R Axis:   215 ?Text Interpretation: Sinus tachycardia LAE, consider biatrial enlargement Right ventricular hypertrophy Inferior infarct, old No old tracing to compare Confirmed by Jacalyn Lefevre [phone]) on 10/28/2021 5:00:34 PM ? ?Radiology ?CT Head Wo Contrast ? ?Result Date: 10/23/2021 ?CLINICAL DATA:  Head trauma, moderate-severe; Neck trauma, dangerous injury mechanism (Age 70-64y) EXAM: CT HEAD WITHOUT CONTRAST CT CERVICAL SPINE WITHOUT CONTRAST TECHNIQUE: Multidetector CT imaging of the head and cervical spine was performed following the standard protocol without intravenous contrast. Multiplanar CT image reconstructions of the cervical spine were also generated. RADIATION DOSE REDUCTION: This exam was performed according to the departmental dose-optimization program which includes automated exposure control, adjustment of the mA and/or kV according to patient size and/or use of iterative reconstruction technique. COMPARISON:  None. FINDINGS: CT HEAD FINDINGS Brain: No evidence of acute infarction, hemorrhage, hydrocephalus, extra-axial collection or mass lesion/mass effect. Mild patchy white matter hypodensities, nonspecific but compatible with chronic microvascular ischemic disease. Vascular: No hyperdense vessel identified. Mild calcific intracranial atherosclerosis. Skull: No acute fracture. Sinuses/Orbits: Visualized  sinuses are clear. No acute orbital findings. Other: No mastoid effusions. CT CERVICAL SPINE FINDINGS Motion limited study. Alignment: Straightening of the normal cervical lordosis. Slight anterolisthesis of C4 on C5, fav

## 2021-10-18 NOTE — Progress Notes (Signed)
Orthopedic Tech Progress Note ?Patient Details:  ?[facility] ?1966-07-30 ?[id number] ? ?Level 2 trauma  ? ?Patient ID: [facility], male   DOB: 29-Jun-1966, 56 y.o.   MRN: [id number] ? ?[facility] ?10/12/2021, 5:20 PM ? ?

## 2021-10-18 NOTE — ED Notes (Signed)
C-collar removed by MD

## 2021-10-18 NOTE — ED Notes (Signed)
Patient transported to CT 

## 2021-10-18 NOTE — ED Triage Notes (Signed)
Pt bib gcems from home as level 2 fall on thinners. Pt has had 3 falls today. Pt felt dizzy prior to fall. Fall from standing position on to hardwood floors. + head trauma. Pt c/o L hip pain, shortening to left leg per ems. 144mcg fent and 4mg  zofran given PTA. Vomited x2 PTA. C-collar in place. 18 LAC ?

## 2021-10-18 NOTE — ED Notes (Signed)
2 attempts made for 2nd set of blood cultures by this RN.  ?

## 2021-10-18 NOTE — Progress Notes (Signed)
?   23-Oct-2021 1519  ?Clinical Encounter Type  ?Visited With Patient not available;Health care provider  ?Visit Type ED;Trauma ?(Level 2 Trauma)  ?Referral From Nurse  ?Consult/Referral To Chaplain  ? ?Responded to page in E.D. Room 16 for Level 2 Trauma. Patient being evaluated and treated by medical staff at this time, patient not seen by Chaplain. No family present at this time. Staff will page Chaplain upon request of patient or family. Chaplain Yulonda Wheeling, M.Min., [phone].   ?

## 2021-10-19 ENCOUNTER — Inpatient Hospital Stay (HOSPITAL_COMMUNITY): Payer: Medicare Other

## 2021-10-19 ENCOUNTER — Encounter (HOSPITAL_COMMUNITY): Payer: Self-pay | Admitting: Internal Medicine

## 2021-10-19 DIAGNOSIS — R6520 Severe sepsis without septic shock: Secondary | ICD-10-CM | POA: Diagnosis not present

## 2021-10-19 DIAGNOSIS — M25552 Pain in left hip: Secondary | ICD-10-CM

## 2021-10-19 DIAGNOSIS — A4102 Sepsis due to Methicillin resistant Staphylococcus aureus: Secondary | ICD-10-CM

## 2021-10-19 DIAGNOSIS — B9562 Methicillin resistant Staphylococcus aureus infection as the cause of diseases classified elsewhere: Secondary | ICD-10-CM

## 2021-10-19 DIAGNOSIS — R7881 Bacteremia: Secondary | ICD-10-CM | POA: Diagnosis not present

## 2021-10-19 DIAGNOSIS — E872 Acidosis, unspecified: Secondary | ICD-10-CM | POA: Diagnosis not present

## 2021-10-19 DIAGNOSIS — A419 Sepsis, unspecified organism: Secondary | ICD-10-CM | POA: Diagnosis not present

## 2021-10-19 LAB — BLOOD CULTURE ID PANEL (REFLEXED) - BCID2

## 2021-10-19 LAB — COMPREHENSIVE METABOLIC PANEL
ALT: 109 U/L — ABNORMAL HIGH (ref 0–44)
AST: 169 U/L — ABNORMAL HIGH (ref 15–41)
Albumin: 2.9 g/dL — ABNORMAL LOW (ref 3.5–5.0)
Alkaline Phosphatase: 62 U/L (ref 38–126)
Anion gap: 11 (ref 5–15)
BUN: 16 mg/dL (ref 6–20)
CO2: 23 mmol/L (ref 22–32)
Calcium: 8.1 mg/dL — ABNORMAL LOW (ref 8.9–10.3)
Chloride: 98 mmol/L (ref 98–111)
Creatinine, Ser: 1.06 mg/dL (ref 0.61–1.24)
GFR, Estimated: >60 mL/min (ref >60)
Glucose, Bld: 107 mg/dL — ABNORMAL HIGH (ref 70–99)
Potassium: 3.3 mmol/L — ABNORMAL LOW (ref 3.5–5.1)
Sodium: 132 mmol/L — ABNORMAL LOW (ref 135–145)
Total Bilirubin: 1.6 mg/dL — ABNORMAL HIGH (ref 0.3–1.2)
Total Protein: 6 g/dL — ABNORMAL LOW (ref 6.5–8.1)

## 2021-10-19 LAB — MAGNESIUM: Magnesium: 1.6 mg/dL — ABNORMAL LOW (ref 1.7–2.4)

## 2021-10-19 LAB — MRSA NEXT GEN BY PCR, NASAL: MRSA by PCR Next Gen: NOT DETECTED

## 2021-10-19 LAB — LACTIC ACID, PLASMA: Lactic Acid, Venous: 2.3 mmol/L (ref 0.5–1.9)

## 2021-10-19 LAB — ECHOCARDIOGRAM COMPLETE
Area-P 1/2: 3.17 cm2
Height: 70 in
S' Lateral: 2.6 cm
Weight: 4000 oz

## 2021-10-19 LAB — PROCALCITONIN: Procalcitonin: 15.68 ng/mL

## 2021-10-19 LAB — APTT
aPTT: 43 seconds — ABNORMAL HIGH (ref 24–36)
aPTT: 39 seconds — ABNORMAL HIGH (ref 24–36)
aPTT: 38 seconds — ABNORMAL HIGH (ref 24–36)

## 2021-10-19 LAB — HEPARIN LEVEL (UNFRACTIONATED): Heparin Unfractionated: 0.35 IU/mL (ref 0.30–0.70)

## 2021-10-19 LAB — CBC
HCT: 47 % (ref 39.0–52.0)
Hemoglobin: 16.2 g/dL (ref 13.0–17.0)
MCH: 29.6 pg (ref 26.0–34.0)
MCHC: 34.5 g/dL (ref 30.0–36.0)
MCV: 85.9 fL (ref 80.0–100.0)
Platelets: 47 10*3/uL — ABNORMAL LOW (ref 150–400)
RBC: 5.47 MIL/uL (ref 4.22–5.81)
RDW: 18 % — ABNORMAL HIGH (ref 11.5–15.5)
WBC: 19 10*3/uL — ABNORMAL HIGH (ref 4.0–10.5)
nRBC: 0 % (ref 0.0–0.2)

## 2021-10-19 LAB — PROTIME-INR
INR: 1.2 (ref 0.8–1.2)
Prothrombin Time: 15.5 seconds — ABNORMAL HIGH (ref 11.4–15.2)

## 2021-10-19 LAB — HIV ANTIBODY (ROUTINE TESTING W REFLEX): HIV Screen 4th Generation wRfx: NONREACTIVE

## 2021-10-19 MED ORDER — ATORVASTATIN CALCIUM 10 MG PO TABS
10.0000 mg | ORAL_TABLET | Freq: Every day | ORAL | Status: DC
  Administered 2021-10-19 – 2021-10-23 (×5): 10 mg via ORAL
  Filled 2021-10-19 (×7): qty 1

## 2021-10-19 MED ORDER — MAGNESIUM SULFATE 2 GM/50ML IV SOLN
2.0000 g | Freq: Once | INTRAVENOUS | Status: AC
  Administered 2021-10-19: 2 g via INTRAVENOUS
  Filled 2021-10-19: qty 50

## 2021-10-19 MED ORDER — POTASSIUM CHLORIDE CRYS ER 20 MEQ PO TBCR
40.0000 meq | EXTENDED_RELEASE_TABLET | Freq: Once | ORAL | Status: AC
  Administered 2021-10-19: 40 meq via ORAL
  Filled 2021-10-19: qty 2

## 2021-10-19 MED ORDER — ALBUTEROL SULFATE (2.5 MG/3ML) 0.083% IN NEBU: 2.5000 mg | INHALATION_SOLUTION | Freq: Four times a day (QID) | RESPIRATORY_TRACT | Status: DC | PRN

## 2021-10-19 MED ORDER — FLUTICASONE FUROATE-VILANTEROL 200-25 MCG/ACT IN AEPB
1.0000 | INHALATION_SPRAY | Freq: Every day | RESPIRATORY_TRACT | Status: DC
  Administered 2021-10-19 – 2021-10-24 (×5): 1 via RESPIRATORY_TRACT
  Filled 2021-10-19: qty 28

## 2021-10-19 MED ORDER — GABAPENTIN 600 MG PO TABS
600.0000 mg | ORAL_TABLET | Freq: Three times a day (TID) | ORAL | Status: DC
Start: 2021-10-19 — End: 2021-10-24
  Administered 2021-10-19 – 2021-10-24 (×15): 600 mg via ORAL
  Filled 2021-10-19 (×18): qty 1

## 2021-10-19 MED ORDER — SODIUM CHLORIDE 0.9 % IV SOLN: INTRAVENOUS | Status: DC

## 2021-10-19 MED ORDER — TAMSULOSIN HCL 0.4 MG PO CAPS
0.4000 mg | ORAL_CAPSULE | Freq: Every day | ORAL | Status: DC
Start: 2021-10-19 — End: 2021-10-25
  Administered 2021-10-19 – 2021-10-24 (×6): 0.4 mg via ORAL
  Filled 2021-10-19 (×7): qty 1

## 2021-10-19 MED ORDER — OXYCODONE HCL 5 MG PO TABS
15.0000 mg | ORAL_TABLET | Freq: Three times a day (TID) | ORAL | Status: DC | PRN
  Administered 2021-10-19 – 2021-10-24 (×11): 15 mg via ORAL
  Filled 2021-10-19 (×12): qty 3

## 2021-10-19 MED ORDER — UMECLIDINIUM BROMIDE 62.5 MCG/ACT IN AEPB
1.0000 | INHALATION_SPRAY | Freq: Every day | RESPIRATORY_TRACT | Status: DC
  Administered 2021-10-19 – 2021-10-24 (×5): 1 via RESPIRATORY_TRACT
  Filled 2021-10-19: qty 7

## 2021-10-19 NOTE — Assessment & Plan Note (Signed)
Patient with known severe left hip osteoarthritis with acute on chronic pain related to fall at home.  CT left hip negative for evidence of fracture. ?-Continue analgesics as needed ?

## 2021-10-19 NOTE — Assessment & Plan Note (Signed)
Patient reports near syncopal episode at home.  History suspicious for orthostatic hypotension in setting of GI losses and sepsis. ?-Keep on telemetry ?-Continue IV fluid hydration overnight ?

## 2021-10-19 NOTE — Progress Notes (Signed)
2D echocardiogram completed. ? ?Ross Ludwig, RDCS ?4:40 PM 10/19/21  ?

## 2021-10-19 NOTE — TOC CAGE-AID Note (Signed)
Transition of Care (TOC) - CAGE-AID Screening ? ? ?Patient Details  ?Name: Jeffrey [facility]win ?MRN: [id number] ?Date of Birth: 1966/01/20 ? ?Transition of Care (TOC) CM/SW Contact:    ?Bonney Berres C Tarpley-Carter, LCSWA ?Phone Number: ?10/19/2021, 2:53 PM ? ? ?Clinical Narrative: ?Pt participated in Cage-Aid.  Pt stated he does not use substance or ETOH.  Pt was not offered resources, due to no usage of substance or ETOH.    ? ?Insurance underwriter, MSW, LCSW-A ?Pronouns:  She/Her/Hers ?[facility]itions of Care ?Clinical Social Worker ?Direct Number:  [phone] ?[email or web address]  ? ?CAGE-AID Screening: ?  ? ?Have You Ever Felt You Ought to Cut Down on Your Drinking or Drug Use?: No ?Have People Annoyed You By Critizing Your Drinking Or Drug Use?: No ?Have You Felt Bad Or Guilty About Your Drinking Or Drug Use?: No ?Have You Ever Had a Drink or Used Drugs First Thing In The Morning to Steady Your Nerves or to Get Rid of a Hangover?: No ?CAGE-AID Score: 0 ? ?Substance Abuse Education Offered: No ? ?  ? ? ? ? ? ? ?

## 2021-10-19 NOTE — Assessment & Plan Note (Signed)
Appears stable.  Continue home trilogy (or pharmacy equivalent) and albuterol as needed. ?

## 2021-10-19 NOTE — Consult Note (Signed)
?   ? ? ? ? ?[facility] for Infectious Disease   ? ?Date of Admission:  10/01/2021    ? ?Reason for Consult: mrsa bacteremia    ?Referring Provider: Charise Carwin, [place] ? ? ? ?Lines:  ?peripheral ? ?Abx: ?3/22-c vanc ? ?3/21-22 cefepime/flagyl      ? ? ?Assessment: ?56 yo male s/p right hip arthroplasty, hx pvd/critical limb ischemia requiring embolectomy 03/2021, admitted for 3-4 days of malaise/f/c and a mechanical fall found to have severe sepsis/mrsa septicemia and left hip pain ? ?Source right ear maceration/ulcer? (Appear traumatic) ? ?Worry it could have seeded the left hip. Would wait another day or 2 to get mri unless pain much worse, eventhough ct is not concerned for acute changes ? ?Right hip arthroplasty assymptomatic at this time but could be potential seeding.  ?Also imaging concerning for septic emboli with splenic/renal infarct ? ?Will need to r/o endocarditis. Eitherway, will need 6 weeks abx, then likely 3 months of suppressive therapy in setting right hip arthroplasty ? ? ? ?Plan: ?Repeat blood culture ?F/u tte -- if negative, would get tee ?Would do mri left hip joint in around 2 days, unless much worse; consider ortho evaluation ?Continue vancomycin; can stop cefepime/flagyl ?Discussed with primary team  ? ? ? ? ?------------------------------------------------ ?Principal Problem: ?  Severe sepsis with lactic acidosis ([place]) ?Active Problems: ?  COPD (chronic obstructive pulmonary disease) ([place]) ?  History of pulmonary embolus (PE) ?  Hyperlipidemia ?  Hypertension ?  PFO (patent foramen ovale) ?  PVD (peripheral vascular disease) ([place]) ?  Abnormal CT scan ?  Enlarged thyroid ?  Near syncope ?  Thrombocytopenia ([place]) ?  AKI (acute kidney injury) ([place]) ?  Osteoarthritis of left hip ?  Sepsis due to methicillin resistant Staphylococcus aureus (MRSA) ([place]) ? ? ? ?HPI: Katriel Hailes is a 56 y.o. male s/p right hip arthroplasty, hx pvd/critical limb ischemia requiring embolectomy 03/2021,  admitted for 3-4 days of malaise/f/c and a mechanical fall found to have severe sepsis/mrsa septicemia and left hip pain ? ?Patient done well since embolectomy for his rle ?Starting about 3-4 days prior to admission felt tired/malaise with subjective f/c. Overall getting weaker and fell on the day of admission attributing it to his arthritic left hip ? ?Has an episode of diarrhea but since resolved ? ?He does have ear maceration/ulceration for a few days as well  ? ?On presentation febrile ?Wbc 19 ?Lactate up ?No further diarrhea ?Bcx returned mrsa ?Abd pelv ct with renal/pelvic infarct ?Ct left hip no osseous acute change outside of chronic djd changes ?On bsAbx ?Overall no clinical change yet in terms of how he feels ? ?Left hip painful ?No rash ?No n/v ?No headache ?No cough ?No chest pain ?Right hip joint no symptoms ? ? ? ? ? ? ?Family History  ?Problem Relation Age of Onset  ? Hypertension Mother   ? Hypertension Father   ? Throat cancer Father   ? ? ?Social History  ? ?Tobacco Use  ? Smoking status: Former  ?  Types: Cigarettes  ?Substance Use Topics  ? Alcohol use: Not Currently  ? Drug use: Not Currently  ? ? ?No Known Allergies ? ?Review of Systems: ?ROS ?All Other ROS was negative, except mentioned above ? ? ?Past Medical History:  ?Diagnosis Date  ? COPD (chronic obstructive pulmonary disease) ([place])   ? History of pulmonary embolus (PE)   ? Hyperlipidemia   ? Hypertension   ? PFO (patent foramen ovale)   ?  PVD (peripheral vascular disease) ([place])   ? ? ? ? ? ?Scheduled Meds: ? atorvastatin  10 mg Oral QHS  ? fluticasone furoate-vilanterol  1 puff Inhalation Daily  ? And  ? umeclidinium bromide  1 puff Inhalation Daily  ? gabapentin  600 mg Oral TID  ? potassium chloride  40 mEq Oral Once  ? sodium chloride flush  3 mL Intravenous Q12H  ? tamsulosin  0.4 mg Oral Daily  ? ?Continuous Infusions: ? sodium chloride 125 mL/hr at 10/19/21 0004  ? sodium chloride    ? ceFEPime (MAXIPIME) IV 2 g (10/19/21 0545)  ?  heparin 1,600 Units/hr (10/19/21 0249)  ? magnesium sulfate bolus IVPB    ? metronidazole 500 mg (10/19/21 0011)  ? vancomycin    ? ?PRN Meds:.acetaminophen **OR** acetaminophen, albuterol, ondansetron **OR** ondansetron (ZOFRAN) IV, oxyCODONE ? ? ?OBJECTIVE: ?Blood pressure (!) 95/46, pulse 96, temperature 97.7 ?F (36.5 ?C), temperature source Oral, resp. rate 18, height 5\' 10"  (1.778 m), weight 113.4 kg, SpO2 95 %. ? ?Physical Exam ? ?General/constitutional: anxious appearing, pleasant, fully conversant ?HEENT: Normocephalic, PER, Conj Clear, poor dentition ?Neck supple ?CV: rrr no mrg ?Lungs: clear to auscultation, normal respiratory effort ?Abd: Soft, Nontender ?Ext: no edema ?Skin: right ear slight ulceration/scabbing upper lobe ?Neuro: nonfocal ?MSK: tender on log roll of the left hip although he can do it better on his own ? ? ?Lab Results ?Lab Results  ?Component Value Date  ? WBC 19.0 (H) 10/19/2021  ? HGB 16.2 10/19/2021  ? HCT 47.0 10/19/2021  ? MCV 85.9 10/19/2021  ? PLT 47 (L) 10/19/2021  ?  ?Lab Results  ?Component Value Date  ? CREATININE 1.06 10/19/2021  ? BUN 16 10/19/2021  ? NA 132 (L) 10/19/2021  ? K 3.3 (L) 10/19/2021  ? CL 98 10/19/2021  ? CO2 23 10/19/2021  ?  ?Lab Results  ?Component Value Date  ? ALT 109 (H) 10/19/2021  ? AST 169 (H) 10/19/2021  ? ALKPHOS 62 10/19/2021  ? BILITOT 1.6 (H) 10/19/2021  ?  ? ? ?Microbiology: ?Recent Results (from the past 240 hour(s))  ?Resp Panel by RT-PCR (Flu A&B, Covid) Nasopharyngeal Swab     Status: None  ? Collection Time: 10/05/2021  4:55 PM  ? Specimen: Nasopharyngeal Swab; Nasopharyngeal(NP) swabs in vial transport medium  ?Result Value Ref Range Status  ? SARS Coronavirus 2 by RT PCR NEGATIVE NEGATIVE Final  ?  Comment: (NOTE) ?SARS-CoV-2 target nucleic acids are NOT DETECTED. ? ?The SARS-CoV-2 RNA is generally detectable in upper respiratory ?specimens during the acute phase of infection. The lowest ?concentration of SARS-CoV-2 viral copies this assay  can detect is ?138 copies/mL. A negative result does not preclude SARS-Cov-2 ?infection and should not be used as the sole basis for treatment or ?other patient management decisions. A negative result may occur with  ?improper specimen collection/handling, submission of specimen other ?than nasopharyngeal swab, presence of viral mutation(s) within the ?areas targeted by this assay, and inadequate number of viral ?copies(<138 copies/mL). A negative result must be combined with ?clinical observations, patient history, and epidemiological ?information. The expected result is Negative. ? ?Fact Sheet for Patients:  ?EntrepreneurPulse.com.au ? ?Fact Sheet for Healthcare Providers:  ?IncredibleEmployment.be ? ?This test is no t yet approved or cleared by the [place] FDA and  ?has been authorized for detection and/or diagnosis of SARS-CoV-2 by ?FDA under an Emergency Use Authorization (EUA). This EUA will remain  ?in effect (meaning this test can be used) for the  duration of the ?COVID-19 declaration under Section 564(b)(1) of the Act, 21 ?U.S.C.section 360bbb-3(b)(1), unless the authorization is terminated  ?or revoked sooner.  ? ? ?  ? Influenza A by PCR NEGATIVE NEGATIVE Final  ? Influenza B by PCR NEGATIVE NEGATIVE Final  ?  Comment: (NOTE) ?The Xpert Xpress SARS-CoV-2/FLU/RSV plus assay is intended as an aid ?in the diagnosis of influenza from Nasopharyngeal swab specimens and ?should not be used as a sole basis for treatment. Nasal washings and ?aspirates are unacceptable for Xpert Xpress SARS-CoV-2/FLU/RSV ?testing. ? ?Fact Sheet for Patients: ?EntrepreneurPulse.com.au ? ?Fact Sheet for Healthcare Providers: ?IncredibleEmployment.be ? ?This test is not yet approved or cleared by the [place] FDA and ?has been authorized for detection and/or diagnosis of SARS-CoV-2 by ?FDA under an Emergency Use Authorization (EUA). This EUA will  remain ?in effect (meaning this test can be used) for the duration of the ?COVID-19 declaration under Section 564(b)(1) of the Act, 21 U.S.C. ?section 360bbb-3(b)(1), unless the authorization is terminated or ?r

## 2021-10-19 NOTE — Progress Notes (Signed)
?PROGRESS NOTE ? ?Jeffrey Goodwin  ?DOB: 1966/01/14  ?PCP: System, Provider Not In ?XBM:[id number]  ?DOA: 10/19/21 ? LOS: 1 day  ?Hospital Day: 2 ? ?Brief narrative: ?Jeffrey Goodwin is a 56 y.o. male with PMH significant for PVD w/ hx of acute limb ischemia s/p RLE embolectomy and fasciotomy (03/2021), COPD, history of DVT/PE on Xarelto, HTN, HLD, severe left hip osteoarthritis, PFO seen on bubble study surface echo ?Patient presented to the ED on 3/21 for evaluation of 2 days of nausea, vomiting, diarrhea, subjective fever, diaphoresis leading to an episode of near syncope and fall ?Patient states when he stood up to go to the bathroom, he felt lightheaded, dizzy and fell to the ground landing on the left hip causing worsening of his chronic severe left hip osteoarthritis.  He did not pass out.  ? ?In the ED, patient was hemodynamically stable but had a temperature 100.7 ?Labs showed WBC 18.8, potassium 3.2, creatinine 1.39 (baseline 0.82 on 05/16/2021), lactic acid 3.3. ?GI pathogen panel and C. difficile pending collection ?Urinalysis unremarkable ?Blood culture sent ?Chest x-ray unremarkable ? ?CT head without contrast negative for acute intracranial abnormality.  ?CT cervical spine without contrast negative for evidence of acute fracture or traumatic malalignment.  Multilevel degenerative changes noted.  Enlarged and heterogeneous thyroid seen. ?  ?CT left hip without contrast showed unchanged severe left hip osteoarthritis without acute osseous abnormality. ?  ?CT chest/abdomen/pelvis with contrast negative for evidence of acute traumatic injury.  New ill-defined hypodensity in the superior right liver is seen and favored to be perfusional and less likely laceration/contusion per radiology read.  New wedge-shaped hypodensity upper pole of left kidney seen concerning for renal infarct.  New focal ill-defined hypodensity in the peripheral inferior spleen may be perfusional versus small splenic  infarct. ? ?Patient was given IV fluid, IV pain meds, IV antibiotics are admitted to hospitalist service. ?Blood culture sent on admission is growing MRSA. ? ?Subjective: ?Patient was seen and examined this morning. ?Middle-aged African-American male.  Alert, awake, oriented x3.  His primary complaint is severe left hip pain as well as pain in the left flank, upper abdomen and left lower back ? ?Principal Problem: ?  Severe sepsis with lactic acidosis (HCC) ?Active Problems: ?  Abnormal CT scan ?  Near syncope ?  PVD (peripheral vascular disease) (HCC) ?  AKI (acute kidney injury) (HCC) ?  History of pulmonary embolus (PE) ?  Thrombocytopenia (HCC) ?  COPD (chronic obstructive pulmonary disease) (HCC) ?  Hyperlipidemia ?  Hypertension ?  PFO (patent foramen ovale) ?  Enlarged thyroid ?  Osteoarthritis of left hip ?  Sepsis due to methicillin resistant Staphylococcus aureus (MRSA) (HCC) ?  MRSA bacteremia ?  Left hip pain ?  ?Assessment and Plan: ?Severe sepsis  ?MRSA bacteremia ?-Unclear source.  Patient seems to have a small traumatic laceration/ulcer in the right ear.  Unclear if it is the source though.  GI pathogen panel and CT panel pending. ?-CT abdomen without source of infection.  Urinalysis unremarkable. ?-ID consult appreciated.  Continue IV vancomycin. ?-continue IV hydration ?-Repeat WBC count, lactic acid level tomorrow. ?Recent Labs  ?Lab 10-19-2021 ?1700 10-19-21 ?1901 10/19/21 ?0637  ?WBC 18.8*  --  19.0*  ?LATICACIDVEN 3.3* 3.2* 2.3*  ?PROCALCITON  --   --  15.68  ? ?Acute on chronic pain of left hip ?-Patient has chronic osteoarthritis of left hip for which she was planned for replacement last year but could not be done because of limb  ischemia. ?-After the fall at home, patient had acute worsening of left hip pain.  CT scan of left hip did not show any new finding. ?-Continue pain management for now.  If no improvement in pain in next 1 to 2 days, may need MRI. ?-At home patient was on oxycodone 15  mg every 8 hours as needed.  Continue same for now. ? ?Near syncope ?-Patient probably had orthostatic hypotension in the setting of sepsis and GI loss.   ?-Continue IV fluid.  Continue telemetry monitoring.   ? 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  Unclear if his history of acute limb ischemia is due to PAD or DVT with PFO.  Patient reports that [facility] and has a plan for TEE to evaluate for possible closure. ?-He reports compliance to Xarelto as an outpatient. ?CT imaging with changes suggestive of left renal infarct, possible small splenic infarct versus perfusional change, and presumed perfusional right liver hypodensity versus hepatic injury.   ?-Currently Xarelto on hold patient is on IV heparin drip. ?-Continue pain management.  Aspirin on hold.  Continue statin ? ?AKI (acute kidney injury)  ?-Baseline creatinine 0.8 from 05/2019.  Creatinine slightly elevated 1.9.  Lisinopril on hold.  Continue to monitor on IV fluid. ?Recent Labs  ?  08-23-2021 ?1700 10/19/21 ?0637  ?BUN 16 16  ?CREATININE 1.39* 1.06  ? ?Thrombocytopenia ?New thrombocytopenia, possibly consumptive process in setting of sepsis.  Patient denies any obvious bleeding.  Monitor closely while on IV heparin. ?Recent Labs  ?Lab 08-23-2021 ?1700 10/19/21 ?0637  ?PLT 96* 47*  ?  ?Enlarged thyroid ?-Enlarged and heterogeneous thyroid seen on CT imaging.  TSH 1.678.  Consider thyroid ultrasound when more stable. ? ?Hypertension ?-Holding home lisinopril for now. ? ?Hyperlipidemia ?-Continue atorvastatin. ? ?COPD (chronic obstructive pulmonary disease)  ?-Continue bronchodilators. ? ?BPH ?-Flomax ? ? ?Goals of care ?  Code Status: Full Code  ? ? ?Mobility: Needs PT  evaluation ? ?Nutritional status:  ?Body mass index is 35.87 kg/m?.  ?  ?  ? ? ? ? ?Diet:  ?Diet Order   ? ?       ?  Diet Heart Room service appropriate? Yes; Fluid consistency: Thin  Diet effective now       ?  ? ?  ?  ? ?  ? ? ?DVT prophylaxis: IV heparin drip ?  ?Antimicrobials: IV vancomycin ?Fluid: NS at 75 ?Consultants: ID ?Family Communication: None at bedside ? ?Status is: Inpatient  ? ?Continue in-hospital care because: Sepsis work-up ?Level of care: Progressive  ? ?Dispo: The patient is from: Home ?             Anticipated d/c is to: Pending clinical course ?             Patient currently is not medically stable to d/c. ?  Difficult to place patient No ? ? ? ? ?Infusions:  ? sodium chloride 75 mL/hr at 10/19/21 1100  ? heparin 1,700 Units/hr (10/19/21 1337)  ? vancomycin    ? ? ?Scheduled Meds: ? atorvastatin  10 mg Oral QHS  ? fluticasone furoate-vilanterol  1 puff Inhalation Daily  ? And  ? umeclidinium bromide  1 puff Inhalation Daily  ? gabapentin  600 mg  Oral TID  ? sodium chloride flush  3 mL Intravenous Q12H  ? tamsulosin  0.4 mg Oral Daily  ? ? ?PRN meds: ?acetaminophen **OR** acetaminophen, albuterol, ondansetron **OR** ondansetron (ZOFRAN) IV, oxyCODONE  ? ?Antimicrobials: ?Anti-infectives (From admission, onward)  ? ? Start     Dose/Rate Route Frequency Ordered Stop  ? 10/19/21 2330  vancomycin (VANCOREADY) IVPB 1500 mg/300 mL       ? 1,500 mg ?150 mL/hr over 120 Minutes Intravenous Every 24 hours 10/03/2021 2329    ? 10/04/2021 2330  ceFEPIme (MAXIPIME) 2 g in sodium chloride 0.9 % 100 mL IVPB  Status:  Discontinued       ? 2 g ?200 mL/hr over 30 Minutes Intravenous Every 8 hours 10/27/2021 2329 10/19/21 1032  ? 09/28/2021 2315  ceFEPIme (MAXIPIME) 2 g in sodium chloride 0.9 % 100 mL IVPB  Status:  Discontinued       ? 2 g ?200 mL/hr over 30 Minutes Intravenous  Once 10/05/2021 2311 10/20/2021 2329  ? 10/07/2021 2315  metroNIDAZOLE (FLAGYL) IVPB 500 mg  Status:  Discontinued       ? 500 mg ?100 mL/hr over  60 Minutes Intravenous Every 12 hours 10/04/2021 2311 10/19/21 1032  ? 10/24/2021 2315  vancomycin (VANCOREADY) IVPB 1750 mg/350 mL       ? 1,750 mg ?175 mL/hr over 120 Minutes Intravenous  Once 09/28/2021 2311 10/19/21 0314

## 2021-10-19 NOTE — Progress Notes (Addendum)
PHARMACY - PHYSICIAN COMMUNICATION ?CRITICAL VALUE ALERT - BLOOD CULTURE IDENTIFICATION (BCID) ? ?Jeffrey Goodwin is an 56 y.o. male who presented to [facility] on 10/09/2021 with a chief complaint of 3 falls with the last landing on left hip and causing pain. ? ?Assessment:  Patient reported nausea, vomiting, diarrhea, subjective fevers, and chills on day 2 of hospital stay. Labs showed WBC 18.8. Patient started on empiric treatment for sepsis of vancomycin, cefepime, and metronidazole on 3/21. Cultures returned 2/2 bottles for MRSA. ? ?Name of physician (or Provider) ContactedMarlowe Aschoff Dahal ? ?Current antibiotics: Cefepime IV 2 g q8h, metronidazole IV q12h, and vancomycin 1500 mg q24h. ? ?Changes to prescribed antibiotics recommended:  ?Continue vancomycin 1500 mg q24h ? ?D/c cefepime and Flagyl ? ?Results for orders placed or performed during the hospital encounter of 10/24/2021  ?Blood Culture ID Panel (Reflexed) (Collected: 10/06/2021  5:38 PM)  ?Result Value Ref Range  ? Enterococcus faecalis NOT DETECTED NOT DETECTED  ? Enterococcus Faecium NOT DETECTED NOT DETECTED  ? Listeria monocytogenes NOT DETECTED NOT DETECTED  ? Staphylococcus species DETECTED (A) NOT DETECTED  ? Staphylococcus aureus (BCID) DETECTED (A) NOT DETECTED  ? Staphylococcus epidermidis NOT DETECTED NOT DETECTED  ? Staphylococcus lugdunensis NOT DETECTED NOT DETECTED  ? Streptococcus species NOT DETECTED NOT DETECTED  ? Streptococcus agalactiae NOT DETECTED NOT DETECTED  ? Streptococcus pneumoniae NOT DETECTED NOT DETECTED  ? Streptococcus pyogenes NOT DETECTED NOT DETECTED  ? A.calcoaceticus-baumannii NOT DETECTED NOT DETECTED  ? Bacteroides fragilis NOT DETECTED NOT DETECTED  ? Enterobacterales NOT DETECTED NOT DETECTED  ? Enterobacter cloacae complex NOT DETECTED NOT DETECTED  ? Escherichia coli NOT DETECTED NOT DETECTED  ? Klebsiella aerogenes NOT DETECTED NOT DETECTED  ? Klebsiella oxytoca NOT DETECTED NOT DETECTED  ? Klebsiella  pneumoniae NOT DETECTED NOT DETECTED  ? Proteus species NOT DETECTED NOT DETECTED  ? Salmonella species NOT DETECTED NOT DETECTED  ? Serratia marcescens NOT DETECTED NOT DETECTED  ? Haemophilus influenzae NOT DETECTED NOT DETECTED  ? Neisseria meningitidis NOT DETECTED NOT DETECTED  ? Pseudomonas aeruginosa NOT DETECTED NOT DETECTED  ? Stenotrophomonas maltophilia NOT DETECTED NOT DETECTED  ? Candida albicans NOT DETECTED NOT DETECTED  ? Candida auris NOT DETECTED NOT DETECTED  ? Candida glabrata NOT DETECTED NOT DETECTED  ? Candida krusei NOT DETECTED NOT DETECTED  ? Candida parapsilosis NOT DETECTED NOT DETECTED  ? Candida tropicalis NOT DETECTED NOT DETECTED  ? Cryptococcus neoformans/gattii NOT DETECTED NOT DETECTED  ? Meth resistant mecA/C and MREJ DETECTED (A) NOT DETECTED  ? ? ?Carma Lair, PharmD Candidate [phone] ?10/19/2021  6:53 AM ? ?

## 2021-10-19 NOTE — Assessment & Plan Note (Signed)
Patient with PFO and right-to-left shunt seen on TTE 04/21/2021.  Follows with [facility] cardiology who have plans for TEE to evaluate for possible closure. ?

## 2021-10-19 NOTE — Assessment & Plan Note (Signed)
New thrombocytopenia, possibly consumptive process in setting of sepsis.  Patient denies any obvious bleeding.  Monitor closely while on IV heparin. ?

## 2021-10-19 NOTE — Assessment & Plan Note (Signed)
Holding home lisinopril for now. ?

## 2021-10-19 NOTE — Assessment & Plan Note (Signed)
Holding home Xarelto, started on IV heparin as above. ?

## 2021-10-19 NOTE — Progress Notes (Signed)
ANTICOAGULATION CONSULT NOTE ? 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 CT with possible renal and splenic infarcts. New thrombocytopenia. ? ?No Known Allergies ? ?Patient Measurements: ?Height: 5\' 10"  (177.8 cm) ?Weight: 113.4 kg (250 lb) ?IBW/kg (Calculated) : 73 ?Heparin Dosing Weight: 97.9 ? ?Vital Signs: ?Temp: 99.3 ?F (37.4 ?C) (03/22 1948) ?Temp Source: Oral (03/22 1948) ?BP: 137/77 (03/22 1948) ?Pulse Rate: 112 (03/22 2156) ? ?Labs: ?Recent Labs  ?  09/29/2021 ?1700 10/19/21 ?0637 10/19/21 ?1250 10/19/21 ?2124  ?HGB 16.8 16.2  --   --   ?HCT 51.1 47.0  --   --   ?PLT 96* 47*  --   --   ?APTT  --  43* 39* 38*  ?LABPROT 17.8* 15.5*  --   --   ?INR 1.5* 1.2  --   --   ?HEPARINUNFRC  --  0.35  --   --   ?CREATININE 1.39* 1.06  --   --   ? ? ? ?Estimated Creatinine Clearance: 97 mL/min (by C-G formula based on SCr of 1.06 mg/dL). ? ?Assessment: ?51 yom with a history of PVD w/ hx of acute limb ischemia s/p RLE embolectomy and fasciotomy (03/2021), COPD, history of DVT/PE on Xarelto, HTN, HLD, severe left hip osteoarthritis, PFO . Patient is presenting with near syncope, fevers, diarrhea.  CT with possible renal and splenic infarcts.  Pharmacy consulted to dose IV heparin (last Xarelto dose on 3/20 at 1800).   ? ?Being conservative with heparin dosing due to thrombocytopenia.  Will also lower goals given increased risk of bleeding.  aPTT remains sub-therapeutic at 38 sec.  Per discussion with RN, no issue with heparin infusion nor bleeding. ? ?Goal of Therapy:  ?Heparin level 0.3-0.5 units/ml ?aPTT 66-85 seconds ?Monitor platelets by anticoagulation protocol: Yes ?  ?Plan:  ?Increase heparin infusion to 1900 units/hr ?F/u AM labs ? ?Allia Wiltsey D. 4/20, PharmD, BCPS, BCCCP ?10/19/2021, 10:24 PM ? ? ?

## 2021-10-19 NOTE — Progress Notes (Signed)
ANTICOAGULATION CONSULT NOTE - Initial Consult ? 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 CT with possible renal and splenic infarcts. New thrombocytopenia. ? ?No Known Allergies ? ?Patient Measurements: ?Height: 5\' 10"  (177.8 cm) ?Weight: 113.4 kg (250 lb) ?IBW/kg (Calculated) : 73 ?Heparin Dosing Weight: 97.9 ? ?Vital Signs: ?Temp: 97.7 ?F (36.5 ?C) (03/22 0825) ?Temp Source: Oral (03/22 0825) ?BP: 95/46 (03/22 0825) ?Pulse Rate: 96 (03/22 0825) ? ?Labs: ?Recent Labs  ?  10/19/2021 ?1700 10/19/21 ?0637  ?HGB 16.8 16.2  ?HCT 51.1 47.0  ?PLT 96* 47*  ?APTT  --  43*  ?LABPROT 17.8* 15.5*  ?INR 1.5* 1.2  ?HEPARINUNFRC  --  0.35  ?CREATININE 1.39* 1.06  ? ? ? ?Estimated Creatinine Clearance: 97 mL/min (by C-G formula based on SCr of 1.06 mg/dL). ? ? ?Medical History: ?Past Medical History:  ?Diagnosis Date  ? COPD (chronic obstructive pulmonary disease) (HCC)   ? History of pulmonary embolus (PE)   ? Hyperlipidemia   ? Hypertension   ? PFO (patent foramen ovale)   ? PVD (peripheral vascular disease) (HCC)   ? ? ?Medications:  ?Medications Prior to Admission  ?Medication Sig Dispense Refill Last Dose  ? albuterol (VENTOLIN HFA) 108 (90 Base) MCG/ACT inhaler Inhale 1 puff into the lungs every 6 (six) hours as needed for wheezing or shortness of breath.   Past Month  ? aspirin 81 MG EC tablet Take 81 mg by mouth daily.   10/16/2021  ? atorvastatin (LIPITOR) 10 MG tablet Take 10 mg by mouth at bedtime.   10/17/2021  ? diclofenac Sodium (VOLTAREN) 1 % GEL Apply 2 g topically 4 (four) times daily as needed (left hip).   10/02/2021  ? gabapentin (NEURONTIN) 600 MG tablet Take 600 mg by mouth 3 (three) times daily.   10/28/2021  ? lisinopril (ZESTRIL) 10 MG tablet Take 10 mg by mouth daily.   10/24/2021  ? methocarbamol (ROBAXIN) 750 MG tablet Take 750 mg by mouth 3 (three) times daily as needed for muscle spasms.   10/17/2021  ? naloxone (NARCAN) nasal spray 4 mg/0.1 mL Place 1 spray into the nose as  directed.   unk  ? oxyCODONE (ROXICODONE) 15 MG immediate release tablet Take 15 mg by mouth 3 (three) times daily as needed for pain.   10/23/2021  ? tamsulosin (FLOMAX) 0.4 MG CAPS capsule Take 0.4 mg by mouth daily.   10/28/2021  ? trazodone (DESYREL) 300 MG tablet Take 300 mg by mouth at bedtime.   10/17/2021  ? TRELEGY ELLIPTA 200-62.5-25 MCG/ACT AEPB Take 1 puff by mouth daily.   10/17/2021  ? XARELTO 20 MG TABS tablet Take 20 mg by mouth every evening.   10/17/2021 at 1800  ? ? ?Scheduled:  ? atorvastatin  10 mg Oral QHS  ? fluticasone furoate-vilanterol  1 puff Inhalation Daily  ? And  ? umeclidinium bromide  1 puff Inhalation Daily  ? gabapentin  600 mg Oral TID  ? potassium chloride  40 mEq Oral Once  ? sodium chloride flush  3 mL Intravenous Q12H  ? tamsulosin  0.4 mg Oral Daily  ? ?Infusions:  ? sodium chloride 125 mL/hr at 10/19/21 0004  ? sodium chloride    ? ceFEPime (MAXIPIME) IV 2 g (10/19/21 0545)  ? heparin 1,600 Units/hr (10/19/21 0249)  ? magnesium sulfate bolus IVPB    ? metronidazole 500 mg (10/19/21 0011)  ? vancomycin    ? ?PRN: acetaminophen **OR** acetaminophen, albuterol, ondansetron **OR**  ondansetron (ZOFRAN) IV, oxyCODONE ? ?Assessment: ?70 yom with a history of PVD w/ hx of acute limb ischemia s/p RLE embolectomy and fasciotomy (03/2021), COPD, history of DVT/PE on Xarelto, HTN, HLD, severe left hip osteoarthritis, PFO . Patient is presenting with Near syncope, fevers, diarrhea. 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 CT with possible renal and splenic infarcts. New thrombocytopenia. . ? ?Patient is on Xarelto prior to arrival. Last dose 3/20 1800. Will require aPTT monitoring due to likely falsely high anti-Xa level secondary to DOAC use. ? ?H/H stable, plts 96>>47 ?Heparin level 0.35 units/mL, aptt 43 sec (4 hours post start of infusion) ?Repeat apTT 39 sec ? ?Goal of Therapy:  ?Heparin level 0.3-0.7 units/ml ?aPTT 66-102 seconds ?Monitor platelets by  anticoagulation protocol: Yes ?  ?Plan:  ?Will increase to heparin 1700 units/hr ?Repeat aptt @2000 . ?Monitor closely with decreasing platelets ?aPTT & anti-Xa level daily while on heparin ?Continue to monitor via aPTT until levels are correlated ?Continue to monitor H&H and platelets ? ?Thank you for allowing pharmacy to be a part of this patient?s care. ? ? , PharmD ?Clinical Pharmacist ? ?Please check AMION for all [facility] Pharm[facility] numbers ?After 10:00 PM, call Main Pharmacy [phone] ? ? ?

## 2021-10-19 NOTE — Assessment & Plan Note (Signed)
Enlarged and heterogeneous thyroid seen on CT imaging.  TSH 1.678.  Consider thyroid ultrasound when more stable. ?

## 2021-10-19 NOTE — Assessment & Plan Note (Signed)
Continue atorvastatin

## 2021-10-19 NOTE — Assessment & Plan Note (Signed)
Mild with creatinine 1.39 on admission compared to 0.8 on 05/16/2021. ?-Hold home lisinopril ?-Continue IV fluid hydration as above and repeat labs in a.m. ?

## 2021-10-19 NOTE — Assessment & Plan Note (Signed)
History of acute limb ischemia s/p RLE embolectomy and fasciotomy September 2022 with [facility] vascular surgery. ?-Holding Xarelto, started on IV heparin as above ?-Continue atorvastatin ?-Hold aspirin for now ?

## 2021-10-19 NOTE — Assessment & Plan Note (Signed)
Patient presenting with leukocytosis, tachycardia, tachypnea, fever, lactic acidosis, new AKI, and thrombocytopenia.  Infectious source unclear.  Does have GI symptoms concerning for infectious gastroenteritis.  Also with CT imaging findings concerning for left renal and possible splenic infarcts which may indicate possible septic emboli.  No evidence of pneumonia on imaging.  Urinalysis not convincing for UTI. ?-Started on empiric IV vancomycin, cefepime, Flagyl ?-Follow blood and urine cultures ?-Follow GI pathogen and C. difficile studies ?-If C. difficile is positive then stop above antibiotics and start on oral vancomycin ?-Continue IV fluid hydration with NS@125  mL/hour overnight ?

## 2021-10-19 NOTE — Hospital Course (Signed)
Jeffrey Goodwin is a 56 y.o. male with medical history significant for PVD w/ hx of acute limb ischemia s/p RLE embolectomy and fasciotomy (03/2021), COPD, history of DVT/PE on Xarelto, HTN, HLD, severe left hip osteoarthritis, PFO seen on bubble study surface echo who is admitted with severe sepsis. ?

## 2021-10-20 ENCOUNTER — Encounter (HOSPITAL_COMMUNITY): Payer: Self-pay | Admitting: Internal Medicine

## 2021-10-20 DIAGNOSIS — R6520 Severe sepsis without septic shock: Secondary | ICD-10-CM | POA: Diagnosis not present

## 2021-10-20 DIAGNOSIS — E872 Acidosis, unspecified: Secondary | ICD-10-CM | POA: Diagnosis not present

## 2021-10-20 DIAGNOSIS — A419 Sepsis, unspecified organism: Secondary | ICD-10-CM | POA: Diagnosis not present

## 2021-10-20 LAB — APTT: aPTT: 46 seconds — ABNORMAL HIGH (ref 24–36)

## 2021-10-20 LAB — HEPATIC FUNCTION PANEL
ALT: 118 U/L — ABNORMAL HIGH (ref 0–44)
AST: 136 U/L — ABNORMAL HIGH (ref 15–41)
Albumin: 2.4 g/dL — ABNORMAL LOW (ref 3.5–5.0)
Alkaline Phosphatase: 87 U/L (ref 38–126)
Bilirubin, Direct: 0.8 mg/dL — ABNORMAL HIGH (ref 0.0–0.2)
Indirect Bilirubin: 1.1 mg/dL — ABNORMAL HIGH (ref 0.3–0.9)
Total Bilirubin: 1.9 mg/dL — ABNORMAL HIGH (ref 0.3–1.2)
Total Protein: 5.3 g/dL — ABNORMAL LOW (ref 6.5–8.1)

## 2021-10-20 LAB — CBC
HCT: 42.1 % (ref 39.0–52.0)
Hemoglobin: 14.4 g/dL (ref 13.0–17.0)
MCH: 29.1 pg (ref 26.0–34.0)
MCHC: 34.2 g/dL (ref 30.0–36.0)
MCV: 85.1 fL (ref 80.0–100.0)
Platelets: 16 10*3/uL — CL (ref 150–400)
RBC: 4.95 MIL/uL (ref 4.22–5.81)
RDW: 17.7 % — ABNORMAL HIGH (ref 11.5–15.5)
WBC: 19.7 10*3/uL — ABNORMAL HIGH (ref 4.0–10.5)
nRBC: 0 % (ref 0.0–0.2)

## 2021-10-20 LAB — BASIC METABOLIC PANEL
Anion gap: 8 (ref 5–15)
BUN: 18 mg/dL (ref 6–20)
CO2: 23 mmol/L (ref 22–32)
Calcium: 7.9 mg/dL — ABNORMAL LOW (ref 8.9–10.3)
Chloride: 103 mmol/L (ref 98–111)
Creatinine, Ser: 0.94 mg/dL (ref 0.61–1.24)
GFR, Estimated: >60 mL/min (ref >60)
Glucose, Bld: 99 mg/dL (ref 70–99)
Potassium: 3.5 mmol/L (ref 3.5–5.1)
Sodium: 134 mmol/L — ABNORMAL LOW (ref 135–145)

## 2021-10-20 LAB — URINE CULTURE: Culture: <10000 — AB

## 2021-10-20 LAB — LACTIC ACID, PLASMA
Lactic Acid, Venous: 2.1 mmol/L (ref 0.5–1.9)
Lactic Acid, Venous: 2.1 mmol/L (ref 0.5–1.9)

## 2021-10-20 LAB — MAGNESIUM: Magnesium: 2.1 mg/dL (ref 1.7–2.4)

## 2021-10-20 LAB — HEPARIN LEVEL (UNFRACTIONATED): Heparin Unfractionated: 0.19 IU/mL — ABNORMAL LOW (ref 0.30–0.70)

## 2021-10-20 LAB — PHOSPHORUS: Phosphorus: 1.6 mg/dL — ABNORMAL LOW (ref 2.5–4.6)

## 2021-10-20 MED ORDER — VANCOMYCIN HCL 1250 MG/250ML IV SOLN
1250.0000 mg | Freq: Two times a day (BID) | INTRAVENOUS | Status: DC
  Administered 2021-10-20 – 2021-10-24 (×10): 1250 mg via INTRAVENOUS
  Filled 2021-10-20 (×12): qty 250

## 2021-10-20 MED ORDER — SODIUM CHLORIDE 0.9 % IV SOLN: 250.0000 mL | INTRAVENOUS | Status: DC | PRN

## 2021-10-20 MED ORDER — SODIUM PHOSPHATES 45 MMOLE/15ML IV SOLN
30.0000 mmol | Freq: Once | INTRAVENOUS | Status: AC
  Administered 2021-10-20: 30 mmol via INTRAVENOUS
  Filled 2021-10-20: qty 10

## 2021-10-20 MED ORDER — SODIUM CHLORIDE 0.9% FLUSH
3.0000 mL | Freq: Two times a day (BID) | INTRAVENOUS | Status: DC
  Administered 2021-10-20 – 2021-10-25 (×11): 3 mL via INTRAVENOUS

## 2021-10-20 MED ORDER — SODIUM CHLORIDE 0.9% FLUSH: 3.0000 mL | INTRAVENOUS | Status: DC | PRN

## 2021-10-20 NOTE — Evaluation (Signed)
Physical Therapy Evaluation ?Patient Details ?Name: Jeffrey Goodwin ?MRN: [id number] ?DOB: 1966/01/14 ?Today's Date: 10/20/2021 ? ?History of Present Illness ? 56 y.o. male presents to [facility] hospital on 10/05/2021 with 2 days of nausea, vomiting, diarrhea, and an episode of near syncope with associated fall. Pt found to have MRSA bacteremia, as well as possible left renal infarct and small splenic infarct. PMH includes PVD, COPD, DVT, HTN, HLD, L hip OA.  ?Clinical Impression ? Pt presents to PT with deficits in functional mobility, gait, balance, strength, power, endurance, and with significant pain. Pt reports pain throughout body, limiting mobility tolerance and power. Pt currently requires significant physical assistance for all functional mobility tasks and remains at a high risk for falls. Acute PT will continue to follow in an effort to improve transfer quality and progress to ambulation. PT recommends AIR admission as the pt was independent prior to this admission and demonstrates the potential to make significant functional gains with high intensity inpatient PT services.   ?   ? ?Recommendations for follow up therapy are one component of a multi-disciplinary discharge planning process, led by the attending physician.  Recommendations may be updated based on patient status, additional functional criteria and insurance authorization. ? ?Follow Up Recommendations Acute inpatient rehab (3hours/day) ? ?  ?Assistance Recommended at Discharge Intermittent Supervision/Assistance  ?Patient can return home with the following ? A lot of help with walking and/or transfers;A lot of help with bathing/dressing/bathroom;Assistance with cooking/housework;Assist for transportation;Help with stairs or ramp for entrance ? ?  ?Equipment Recommendations Wheelchair (measurements PT);Wheelchair cushion (measurements PT);BSC/3in1;Hospital bed;Rolling walker (2 wheels)  ?Recommendations for Other Services ? Rehab consult  ?   ?Functional Status Assessment Patient has had a recent decline in their functional status and demonstrates the ability to make significant improvements in function in a reasonable and predictable amount of time.  ? ?  ?Precautions / Restrictions Precautions ?Precautions: Fall ?Restrictions ?Weight Bearing Restrictions: No  ? ?  ? ?Mobility ? Bed Mobility ?Overal bed mobility: Needs Assistance ?Bed Mobility: Supine to Sit ?  ?  ?Supine to sit: Max assist, HOB elevated ?  ?  ?  ?  ? ?Transfers ?Overall transfer level: Needs assistance ?Equipment used: 1 person hand held assist ?Transfers: Bed to chair/wheelchair/BSC ?  ?  ?  ?  ?  ? Lateral/Scoot Transfers: Mod assist ?General transfer comment: pt performs lateral scoot from elevated bed to drop arm recliner, requires physical assist and multiple scooting attempts to transfer ?  ? ?Ambulation/Gait ?  ?  ?  ?  ?  ?  ?  ?  ? ?Stairs ?  ?  ?  ?  ?  ? ?Wheelchair Mobility ?  ? ?Modified Rankin (Stroke Patients Only) ?  ? ?  ? ?Balance Overall balance assessment: Needs assistance ?Sitting-balance support: No upper extremity supported, Feet supported ?Sitting balance-Leahy Scale: Fair ?  ?  ?Standing balance support:  (pt does not stand this session) ?  ?  ?  ?  ?  ?  ?  ?  ?  ?  ?  ?  ?  ?   ? ? ? ?Pertinent Vitals/Pain Pain Assessment ?Pain Assessment: 0-10 ?Pain Score: 10-Worst pain ever ?Pain Location: generalized ?Pain Descriptors / Indicators: Sore ?Pain Intervention(s): Monitored during session, Patient requesting pain meds-RN notified  ? ? ?Home Living Family/patient expects to be discharged to:: Private residence ?Living Arrangements: Non-relatives/Friends ?Available Help at Discharge: Other (Comment) (roommates PRN) ?Type of Home: House ?Home Access: Stairs  to enter ?Entrance Stairs-Rails: None ?Entrance Stairs-Number of Steps: 1 ?  ?Home Layout: Two level;Able to live on main level with bedroom/bathroom ?Home Equipment: Rollator (4 wheels);Shower seat ?   ?   ?Prior Function Prior Level of Function : Independent/Modified Independent ?  ?  ?  ?  ?  ?  ?Mobility Comments: ambulate with rollator ?  ?  ? ? ?Hand Dominance  ? Dominant Hand: Right ? ?  ?Extremity/Trunk Assessment  ? Upper Extremity Assessment ?Upper Extremity Assessment: Generalized weakness ?  ? ?Lower Extremity Assessment ?Lower Extremity Assessment: Generalized weakness ?  ? ?Cervical / Trunk Assessment ?Cervical / Trunk Assessment: Normal  ?Communication  ? Communication: No difficulties  ?Cognition Arousal/Alertness: Awake/alert ?Behavior During Therapy: [facility] for tasks assessed/performed ?Overall Cognitive Status: Impaired/Different from baseline ?Area of Impairment: Problem solving ?  ?  ?  ?  ?  ?  ?  ?  ?  ?  ?  ?  ?  ?  ?Problem Solving: Slow processing ?  ?  ?  ? ?  ?General Comments General comments (skin integrity, edema, etc.): pt tachycardic during session, on 2L [place] upon PT arrival, requires increase to 3L [place] with mobility due to desaturation ? ?  ?Exercises    ? ?Assessment/Plan  ?  ?PT Assessment Patient needs continued PT services  ?PT Problem List Decreased strength;Decreased activity tolerance;Decreased balance;Decreased mobility;Cardiopulmonary status limiting activity;Pain ? ?   ?  ?PT Treatment Interventions DME instruction;Gait training;Functional mobility training;Therapeutic activities;Stair training;Therapeutic exercise;Balance training;Neuromuscular re-education;Patient/family education   ? ?PT Goals (Current goals can be found in the Care Plan section)  ?Acute Rehab PT Goals ?Patient Stated Goal: to reduce pain and return to independence ?PT Goal Formulation: With patient ?Time For Goal Achievement: 11/03/21 ?Potential to Achieve Goals: Fair ? ?  ?Frequency Min 3X/week ?  ? ? ?Co-evaluation   ?  ?  ?  ?  ? ? ?  ?AM-PAC PT "6 Clicks" Mobility  ?Outcome Measure Help needed turning from your back to your side while in a flat bed without using bedrails?: A Lot ?Help needed moving from  lying on your back to sitting on the side of a flat bed without using bedrails?: A Lot ?Help needed moving to and from a bed to a chair (including a wheelchair)?: A Lot ?Help needed standing up from a chair using your arms (e.g., wheelchair or bedside chair)?: A Lot ?Help needed to walk in hospital room?: Total ?Help needed climbing 3-5 steps with a railing? : Total ?6 Click Score: 10 ? ?  ?End of Session Equipment Utilized During Treatment: Oxygen ?Activity Tolerance: Patient limited by pain ?Patient left: in chair;with call bell/phone within reach;with chair alarm set ?Nurse Communication: Mobility status ?PT Visit Diagnosis: Other abnormalities of gait and mobility (R26.89);Muscle weakness (generalized) (M62.81);Pain ?Pain - Right/Left: Right ?Pain - part of body: Hip ?  ? ?Time: 6294-7654 ?PT Time Calculation (min) (ACUTE ONLY): 31 min ? ? ?Charges:   PT Evaluation ?$PT Eval Low Complexity: 1 Low ?  ?  ?   ? ? ?Arlyss Gandy, PT, DPT ?Acute Rehabilitation ?Pager: [phone] ?Office [phone] ? ? ?Arlyss Gandy ?10/20/2021, 11:16 AM ? ?

## 2021-10-20 NOTE — Progress Notes (Signed)
?   ? ? ? ? ?[facility] ? ?Date of Admission:  05-29-22    ? ?Lines:  ?peripheral ?  ?Abx: ?3/22-c vanc ?  ?3/21-22 cefepime/flagyl                                                         ?  ?  ?Assessment: ?56 yo male s/p right hip arthroplasty, hx pvd/critical limb ischemia requiring embolectomy 03/2021, admitted for 3-4 days of malaise/f/c and a mechanical fall found to have severe sepsis/mrsa septicemia and left hip pain ?  ?Source right ear maceration/ulcer? (Appear traumatic) ?  ?Worry it could have seeded the left hip. Would wait another day or 2 to get mri unless pain much worse, eventhough ct is not concerned for acute changes ?  ?Right hip arthroplasty assymptomatic at this time but could be potential seeding.  ?Also imaging concerning for septic emboli with splenic/renal infarct ?  ?Will need to r/o endocarditis. Eitherway, will need 6 weeks abx, then likely 3 months of suppressive therapy in setting right hip arthroplasty ?  ?3/23 assessment ?Dropping platelet -- oncology following checking for HIT. But agree suspect sepsis related ?Left hip joint pain stable, still febrile ?No new clinical finding otherwise ? ?Tte no obvious vegetation ?Repeat bcx in process ?  ?Plan: ?F/u 3/23 repeat blood culture ?Tee setup for tomorrow ?Monitor left hip ?Continue vancomycin ?Discussed with primary team  ? ?Principal Problem: ?  Severe sepsis with lactic acidosis (HCC) ?Active Problems: ?  COPD (chronic obstructive pulmonary disease) (HCC) ?  History of pulmonary embolus (PE) ?  Hyperlipidemia ?  Hypertension ?  PFO (patent foramen ovale) ?  PVD (peripheral vascular disease) (HCC) ?  Abnormal CT scan ?  Enlarged thyroid ?  Near syncope ?  Thrombocytopenia (HCC) ?  AKI (acute kidney injury) (HCC) ?  Osteoarthritis of left hip ?  Sepsis due to methicillin resistant Staphylococcus aureus (MRSA) (HCC) ?  MRSA bacteremia ?  Left hip pain ? ? ?No Known Allergies ? ?Scheduled Meds: ? atorvastatin   10 mg Oral QHS  ? fluticasone furoate-vilanterol  1 puff Inhalation Daily  ? And  ? umeclidinium bromide  1 puff Inhalation Daily  ? gabapentin  600 mg Oral TID  ? sodium chloride flush  3 mL Intravenous Q12H  ? sodium chloride flush  3 mL Intravenous Q12H  ? tamsulosin  0.4 mg Oral Daily  ? ?Continuous Infusions: ? sodium chloride 75 mL/hr at 10/20/21 0700  ? sodium chloride    ? vancomycin 1,250 mg (10/20/21 1158)  ? ?PRN Meds:.sodium chloride, acetaminophen **OR** acetaminophen, albuterol, ondansetron **OR** ondansetron (ZOFRAN) IV, oxyCODONE, sodium chloride flush ? ? ?SUBJECTIVE: ?Up in chair ?Feels somewhat better but left hip still very painful ?No headache ?No n/v/diarrhea ?Platelet dropping -- oncology consulting ? ?Review of Systems: ?ROS ?All other ROS was negative, except mentioned above ? ? ? ? ?OBJECTIVE: ?Vitals:  ? 10/20/21 0627 10/20/21 0749 10/20/21 0814 10/20/21 1129  ?BP:  118/70  114/89  ?Pulse: (!) 113 (!) 110  (!) 103  ?Resp: 12 20  14   ?Temp:  100.3 ?F (37.9 ?C)  98.8 ?F (37.1 ?C)  ?TempSrc:  Axillary  Oral  ?SpO2: 96% 93% 94% 96%  ?Weight:      ?Height:      ? ?  Body mass index is 35.87 kg/m?. ? ?Physical Exam ? ?General/constitutional: sitting in chair, legs flexed at hip ?HEENT: Normocephalic, poor dentition ?Neck supple ?CV: rrr no mrg ?Lungs: clear to auscultation, normal respiratory effort ?Abd: Soft, Nontender ?Ext: no edema ?Skin: No Rash ?Neuro: nonfocal ?MSK: left hip joint tender on rom although somewhat preserved rom ? ? ?Lab Results ?Lab Results  ?Component Value Date  ? WBC 19.7 (H) 10/20/2021  ? HGB 14.4 10/20/2021  ? HCT 42.1 10/20/2021  ? MCV 85.1 10/20/2021  ? PLT 16 (LL) 10/20/2021  ?  ?Lab Results  ?Component Value Date  ? CREATININE 0.94 10/20/2021  ? BUN 18 10/20/2021  ? NA 134 (L) 10/20/2021  ? K 3.5 10/20/2021  ? CL 103 10/20/2021  ? CO2 23 10/20/2021  ?  ?Lab Results  ?Component Value Date  ? ALT 118 (H) 10/20/2021  ? AST 136 (H) 10/20/2021  ? ALKPHOS 87 10/20/2021  ?  BILITOT 1.9 (H) 10/20/2021  ?  ? ? ?Microbiology: ?Recent Results (from the past 240 hour(s))  ?Resp Panel by RT-PCR (Flu A&B, Covid) Nasopharyngeal Swab     Status: None  ? Collection Time: 2021-10-24  4:55 PM  ? Specimen: Nasopharyngeal Swab; Nasopharyngeal(NP) swabs in vial transport medium  ?Result Value Ref Range Status  ? SARS Coronavirus 2 by RT PCR NEGATIVE NEGATIVE Final  ?  Comment: (NOTE) ?SARS-CoV-2 target nucleic acids are NOT DETECTED. ? ?The SARS-CoV-2 RNA is generally detectable in upper respiratory ?specimens during the acute phase of infection. The lowest ?concentration of SARS-CoV-2 viral copies this assay can detect is ?138 copies/mL. A negative result does not preclude SARS-Cov-2 ?infection and should not be used as the sole basis for treatment or ?other patient management decisions. A negative result may occur with  ?improper specimen collection/handling, submission of specimen other ?than nasopharyngeal swab, presence of viral mutation(s) within the ?areas targeted by this assay, and inadequate number of viral ?copies(<138 copies/mL). A negative result must be combined with ?clinical observations, patient history, and epidemiological ?information. The expected result is Negative. ? ?Fact Sheet for Patients:  ?BloggerCourse.com ? ?Fact Sheet for Healthcare Providers:  ?SeriousBroker.it ? ?This test is no t yet approved or cleared by the [place] FDA and  ?has been authorized for detection and/or diagnosis of SARS-CoV-2 by ?FDA under an Emergency Use Authorization (EUA). This EUA will remain  ?in effect (meaning this test can be used) for the duration of the ?COVID-19 declaration under Section 564(b)(1) of the Act, 21 ?U.S.C.section 360bbb-3(b)(1), unless the authorization is terminated  ?or revoked sooner.  ? ? ?  ? Influenza A by PCR NEGATIVE NEGATIVE Final  ? Influenza B by PCR NEGATIVE NEGATIVE Final  ?  Comment: (NOTE) ?The Xpert Xpress  SARS-CoV-2/FLU/RSV plus assay is intended as an aid ?in the diagnosis of influenza from Nasopharyngeal swab specimens and ?should not be used as a sole basis for treatment. Nasal washings and ?aspirates are unacceptable for Xpert Xpress SARS-CoV-2/FLU/RSV ?testing. ? ?Fact Sheet for Patients: ?BloggerCourse.com ? ?Fact Sheet for Healthcare Providers: ?SeriousBroker.it ? ?This test is not yet approved or cleared by the [place] FDA and ?has been authorized for detection and/or diagnosis of SARS-CoV-2 by ?FDA under an Emergency Use Authorization (EUA). This EUA will remain ?in effect (meaning this test can be used) for the duration of the ?COVID-19 declaration under Section 564(b)(1) of the Act, 21 U.S.C. ?section 360bbb-3(b)(1), unless the authorization is terminated or ?revoked. ? ?Performed at [facility], [address] [place], [place] ?[address] ?  ?Culture, blood (routine x 2)     Status: Abnormal (Preliminary result)  ? Collection Time: 10/15/2021  5:38 PM  ? Specimen: BLOOD  ?Result Value Ref Range Status  ? Specimen Description BLOOD RIGHT ANTECUBITAL  Final  ? Special Requests   Final  ?  BOTTLES DRAWN AEROBIC AND ANAEROBIC Blood Culture adequate volume  ? Culture  Setup Time   Final  ?  GRAM POSITIVE COCCI IN CLUSTERS ?IN BOTH AEROBIC AND ANAEROBIC BOTTLES ?CRITICAL RESULT CALLED TO, READ BACK BY AND VERIFIED WITH: V BRYK,PHARMD@0635  10/19/21 MK ?  ? Culture (A)  Final  ?  STAPHYLOCOCCUS AUREUS ?SUSCEPTIBILITIES TO FOLLOW ?Performed at [facility], [address], [place], [place] [address] ?  ? Report Status PENDING  Incomplete  ?Blood Culture ID Panel (Reflexed)     Status: Abnormal  ? Collection Time: 10/01/2021  5:38 PM  ?Result Value Ref Range Status  ? Enterococcus faecalis NOT DETECTED NOT DETECTED Final  ? Enterococcus Faecium NOT DETECTED NOT DETECTED Final  ? Listeria monocytogenes NOT DETECTED NOT DETECTED Final  ? Staphylococcus  species DETECTED (A) NOT DETECTED Final  ?  Comment: CRITICAL RESULT CALLED TO, READ BACK BY AND VERIFIED WITH: ?V BRYK,PHARMD@0635  10/19/21 MK ?  ? Staphylococcus aureus (BCID) DETECTED (A) NOT DETECT

## 2021-10-20 NOTE — Progress Notes (Signed)
ANTICOAGULATION CONSULT NOTE ? 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 CT with possible renal and splenic infarcts. New thrombocytopenia. ? ?No Known Allergies ? ?Patient Measurements: ?Height: 5\' 10"  (177.8 cm) ?Weight: 113.4 kg (250 lb) ?IBW/kg (Calculated) : 73 ?Heparin Dosing Weight: 97.9 ? ?Vital Signs: ?Temp: 100.3 ?F (37.9 ?C) (03/23 0749) ?Temp Source: Axillary (03/23 0749) ?BP: 118/70 (03/23 0749) ?Pulse Rate: 110 (03/23 0749) ? ?Labs: ?Recent Labs  ?  10/13/2021 ?1700 10/17/2021 ?1700 10/19/21 ?0637 10/19/21 ?1250 10/19/21 ?2124 10/20/21 ?10/22/21  ?HGB 16.8  --  16.2  --   --  14.4  ?HCT 51.1  --  47.0  --   --  42.1  ?PLT 96*  --  47*  --   --  16*  ?APTT  --    < > 43* 39* 38* 46*  ?LABPROT 17.8*  --  15.5*  --   --   --   ?INR 1.5*  --  1.2  --   --   --   ?HEPARINUNFRC  --   --  0.35  --   --  0.19*  ?CREATININE 1.39*  --  1.06  --   --  0.94  ? < > = values in this interval not displayed.  ? ? ? ?Estimated Creatinine Clearance: 109.4 mL/min (by C-G formula based on SCr of 0.94 mg/dL). ? ?Assessment: ?75 yom with a history of PVD w/ hx of acute limb ischemia s/p RLE embolectomy and fasciotomy (03/2021), COPD, history of DVT/PE on Xarelto, HTN, HLD, severe left hip osteoarthritis, PFO . Patient is presenting with near syncope, fevers, diarrhea.  CT with possible renal and splenic infarcts.  Pharmacy consulted to dose IV heparin (last Xarelto dose on 3/20 at 1800).   ? ?Being conservative with heparin dosing due to thrombocytopenia.  Will also lower goals given increased risk of bleeding.  aPTT remains sub-therapeutic at 38 sec.  Per discussion with RN, no issue with heparin infusion nor bleeding. ? ?Plts 16 this am.  ? ?Goal of Therapy:  ?Heparin level 0.3-0.5 units/ml ?Monitor platelets by anticoagulation protocol: Yes ?  ?Plan:  ?Hold heparin drip with significant thrombocytopenia. Follow up with MD for restart.  ? ? ? ?

## 2021-10-20 NOTE — Consult Note (Addendum)
? ?[place]  ?Telephone:(336) [phone] [phone]) [phone]  ? ? ?INITIAL HEMATOLOGY CONSULTATION ? ?Referring MD:  Dr. Marlowe Aschoff Dahal ? ?Reason for Referral: Thrombocytopenia ? ?HPI: Jeffrey Goodwin is a 56 year old male with a past medical history significant for PVD with history of acute limb ischemia status post right lower extremity embolectomy and physiognomy in September 2022, history of DVT/PE on Xarelto, hypertension, hyperlipidemia, severe left hip osteoarthritis, PFO seen on bubble study echo.  He presented to the emergency department with near syncope, fevers, diarrhea.  On admission, his WBC was 18.8, hemoglobin 16.8, platelets 96,000, BUN 16, creatinine 1.39, AST 76, ALT 42, alk phos 61, T. bili 2.0, INR 1.5, lactic acid 3.3.  He was admitted with severe sepsis and found to have MRSA bacteremia.  Receiving IV cefepime, metronidazole, vancomycin.  Was placed on IV heparin this admission.  Platelet count has dropped quickly this admission down to 16,000 today.  Heparin has been discontinued.  HIT panel has been sent and is currently pending. ? ?The patient is sitting up in the recliner today.  He denies any current bleeding.  Frustrated because he is tired of getting stuck for lab draws.  He reports pain to his lower back/buttocks.  Continues to have intermittent low-grade fevers.  He is not having any headaches, dizziness, chest pain, shortness of breath, abdominal pain, nausea, vomiting.  He reports constipation.  States that he has never required any blood or platelet transfusions in the past.  The patient tells me that he lives in a boarding house.  He has 12 adult children.  He is a former smoker.  Denies alcohol use.  Denies family history of blood disorders or hematologic malignancy.  Hematology was asked to see the patient to make recommendations regarding his thrombocytopenia. ? ?Past Medical History:  ?Diagnosis Date  ? COPD (chronic obstructive pulmonary disease) ([place])   ? History of  pulmonary embolus (PE)   ? Hyperlipidemia   ? Hypertension   ? PFO (patent foramen ovale)   ? PVD (peripheral vascular disease) ([place])   ?: ? ?Past Surgical History:  ?Procedure Laterality Date  ? FASCIOTOMY CLOSURE Right 04/25/2021  ? THROMBECTOMY ILIAC ARTERY Bilateral 04/23/2021  ? TOTAL HIP ARTHROPLASTY Right   ?: ? ? ?CURRENT MEDS: ?Current Facility-Administered Medications  ?Medication Dose Route Frequency Provider Last Rate Last Admin  ? 0.9 %  sodium chloride infusion   Intravenous Continuous Dahal, Binaya, MD 75 mL/hr at 10/20/21 0700 Infusion Verify at 10/20/21 0700  ? 0.9 %  sodium chloride infusion  250 mL Intravenous PRN Dahal, Marlowe Aschoff, MD      ? acetaminophen (TYLENOL) tablet 650 mg  650 mg Oral Q6H PRN Lenore Cordia, MD   650 mg at 10/20/21 0853  ? Or  ? acetaminophen (TYLENOL) suppository 650 mg  650 mg Rectal Q6H PRN Zada Finders R, MD      ? albuterol (PROVENTIL) (2.5 MG/3ML) 0.083% nebulizer solution 2.5 mg  2.5 mg Inhalation Q6H PRN Lenore Cordia, MD      ? atorvastatin (LIPITOR) tablet 10 mg  10 mg Oral QHS Lenore Cordia, MD   10 mg at 10/19/21 2119  ? fluticasone furoate-vilanterol (BREO ELLIPTA) 200-25 MCG/ACT 1 puff  1 puff Inhalation Daily Lenore Cordia, MD   1 puff at 10/20/21 5462  ? And  ? umeclidinium bromide (INCRUSE ELLIPTA) 62.5 MCG/ACT 1 puff  1 puff Inhalation Daily Lenore Cordia, MD   1 puff at 10/20/21 7035  ?  gabapentin (NEURONTIN) tablet 600 mg  600 mg Oral TID Lenore Cordia, MD   600 mg at 10/20/21 1610  ? ondansetron (ZOFRAN) tablet 4 mg  4 mg Oral Q6H PRN Lenore Cordia, MD      ? Or  ? ondansetron (ZOFRAN) injection 4 mg  4 mg Intravenous Q6H PRN Lenore Cordia, MD   4 mg at 10/19/21 0006  ? oxyCODONE (Oxy IR/ROXICODONE) immediate release tablet 15 mg  15 mg Oral Q8H PRN Terrilee Croak, MD   15 mg at 10/20/21 0618  ? sodium chloride flush (NS) 0.9 % injection 3 mL  3 mL Intravenous Q12H Lenore Cordia, MD   3 mL at 10/20/21 0857  ? sodium chloride flush (NS) 0.9  % injection 3 mL  3 mL Intravenous Q12H Dahal, Binaya, MD      ? sodium chloride flush (NS) 0.9 % injection 3 mL  3 mL Intravenous PRN Dahal, Binaya, MD      ? sodium phosphate 30 mmol in dextrose 5 % 250 mL infusion  30 mmol Intravenous Once Terrilee Croak, MD 43 mL/hr at 10/20/21 0842 30 mmol at 10/20/21 0842  ? tamsulosin (FLOMAX) capsule 0.4 mg  0.4 mg Oral Daily Zada Finders R, MD   0.4 mg at 10/20/21 9604  ? vancomycin (VANCOREADY) IVPB 1250 mg/250 mL  1,250 mg Intravenous Q12H Jerilynn Birkenhead, RPH      ? ? ? ? ?No Known Allergies: ? ? ?Family History  ?Problem Relation Age of Onset  ? Hypertension Mother   ? Hypertension Father   ? Throat cancer Father   ?: ? ? ?Social History  ? ?Socioeconomic History  ? Marital status: Single  ?  Spouse name: Not on file  ? Number of children: Not on file  ? Years of education: Not on file  ? Highest education level: Not on file  ?Occupational History  ? Not on file  ?Tobacco Use  ? Smoking status: Former  ?  Types: Cigarettes  ? Smokeless tobacco: Not on file  ?Substance and Sexual Activity  ? Alcohol use: Not Currently  ? Drug use: Not Currently  ? Sexual activity: Not on file  ?Other Topics Concern  ? Not on file  ?Social History Narrative  ? Not on file  ? ?Social Determinants of Health  ? ?Financial Resource Strain: Not on file  ?Food Insecurity: Not on file  ?Transportation Needs: Not on file  ?Physical Activity: Not on file  ?Stress: Not on file  ?Social Connections: Not on file  ?Intimate Partner Violence: Not on file  ?: ? ?REVIEW OF SYSTEMS:  A comprehensive 14 point review of systems was negative except as noted in the HPI.   ? ?Exam: ?Patient Vitals for the past 24 hrs: ? BP Temp Temp src Pulse Resp SpO2  ?10/20/21 1129 -- 98.8 ?F (37.1 ?C) Oral (!) 103 14 96 %  ?10/20/21 0814 -- -- -- -- -- 94 %  ?10/20/21 0749 118/70 100.3 ?F (37.9 ?C) Axillary (!) 110 20 93 %  ?10/20/21 0627 -- -- -- (!) 113 12 96 %  ?10/20/21 0618 -- -- -- (!) 116 (!) 0 94 %  ?10/20/21  0555 -- -- -- (!) 113 18 94 %  ?10/20/21 0400 -- -- -- (!) 116 (!) 22 95 %  ?10/20/21 0348 (!) 142/69 99.2 ?F (37.3 ?C) Axillary (!) 111 16 95 %  ?10/19/21 2328 126/67 99.2 ?F (37.3 ?C) Axillary (!) 110 20 95 %  ?  10/19/21 2156 -- -- -- (!) 112 14 94 %  ?10/19/21 2119 -- -- -- (!) 111 16 92 %  ?10/19/21 1948 137/77 99.3 ?F (37.4 ?C) Oral (!) 108 20 91 %  ?10/19/21 1900 -- -- -- (!) 103 18 (!) 89 %  ?10/19/21 1538 131/65 97.6 ?F (36.4 ?C) Oral (!) 104 20 94 %  ? ? ?Physical Exam ?Vitals reviewed.  ?Constitutional:   ?   General: He is not in acute distress. ?HENT:  ?   Head: Normocephalic.  ?   Mouth/Throat:  ?   Pharynx: No oropharyngeal exudate or posterior oropharyngeal erythema.  ?Eyes:  ?   General: No scleral icterus. ?   Conjunctiva/sclera: Conjunctivae normal.  ?Cardiovascular:  ?   Rate and Rhythm: Normal rate and regular rhythm.  ?Pulmonary:  ?   Effort: No respiratory distress.  ?   Breath sounds: Normal breath sounds.  ?Abdominal:  ?   Palpations: Abdomen is soft.  ?   Tenderness: There is no abdominal tenderness.  ?Skin: ?   General: Skin is warm and dry.  ?Neurological:  ?   Mental Status: He is alert and oriented to person, place, and time.  ? ? ?LABS: ? ?Lab Results  ?Component Value Date  ? WBC 19.7 (H) 10/20/2021  ? HGB 14.4 10/20/2021  ? HCT 42.1 10/20/2021  ? PLT 16 (LL) 10/20/2021  ? GLUCOSE 99 10/20/2021  ? ALT 118 (H) 10/20/2021  ? AST 136 (H) 10/20/2021  ? NA 134 (L) 10/20/2021  ? K 3.5 10/20/2021  ? CL 103 10/20/2021  ? CREATININE 0.94 10/20/2021  ? BUN 18 10/20/2021  ? CO2 23 10/20/2021  ? INR 1.2 10/19/2021  ? ? ?CT Head Wo Contrast ? ?Result Date: 10/26/2021 ?CLINICAL DATA:  Head trauma, moderate-severe; Neck trauma, dangerous injury mechanism (Age 33-64y) EXAM: CT HEAD WITHOUT CONTRAST CT CERVICAL SPINE WITHOUT CONTRAST TECHNIQUE: Multidetector CT imaging of the head and cervical spine was performed following the standard protocol without intravenous contrast. Multiplanar CT image  reconstructions of the cervical spine were also generated. RADIATION DOSE REDUCTION: This exam was performed according to the departmental dose-optimization program which includes automated exposure control, adj

## 2021-10-20 NOTE — Discharge Planning (Signed)
Oncology Discharge Planning Admission Note ? ?[facility] ?Address: [address] [place], [place], [place] [address] ?Hours of Operation:  8am - 5pm, Monday - Friday  ?Clinic Contact Information:  [phone]) [phone] ? ?Oncology Care Team: ?Medical Oncologist:   ? ?Jenell Milliner - NP is aware of this hospital admission dated 10/27/2021 and has assessed patient at the bedside and the cancer center will follow Minette Brine inpatient care to assist with discharge planning as indicated by the oncologist.  We will arrange for outpatient follow up if needed closer to discharge if possible. ? ?Disclaimer:  This Cancer Center nursing note does not imply a formal consult request has been made by the admitting attending for this admission or there will be an inpatient consult completed by oncology.  Please request oncology consults as per standard process as indicated. ?

## 2021-10-20 NOTE — Progress Notes (Signed)
? ?  Inpatient Rehab Admissions Coordinator : ? ?Per therapy recommendations, patient was screened for CIR candidacy by Kaysia Willard RN MSN.  At this time patient appears to be a potential candidate for CIR. I will place a rehab consult per protocol for full assessment. Please call me with any questions. ? ?Tiawana Forgy RN MSN ?Admissions Coordinator ?[phone] ?  ?

## 2021-10-20 NOTE — Progress Notes (Addendum)
?PROGRESS NOTE ? ?Jeffrey Goodwin  ?DOB: [birth year]  ?PCP: System, Provider Not In ?[id number]  ?DOA: 10/16/2021 ? LOS: 2 days  ?Hospital Day: 3 ? ?Brief narrative: ?Jeffrey Racernthony George Cross is a 56 y.o. male with PMH significant for DVT/PE on Xarelto, hx of acute limb ischemia s/p RLE embolectomy and fasciotomy (03/2021 at [facility]) after which he was started on Xarelto, found to have PFO during that admission; COPD, HTN, HLD, severe left hip osteoarthritis. ?Patient presented to the ED on 3/21 for evaluation of 2 days of nausea, vomiting, diarrhea, subjective fever, diaphoresis leading to an episode of near syncope and fall ?Patient states when he stood up to go to the bathroom, he felt lightheaded, dizzy and fell to the ground landing on the left hip causing worsening of his chronic severe left hip osteoarthritis.  He did not pass out.  ? ?In the ED, patient was hemodynamically stable but had a temperature 100.7 ?Labs showed WBC 18.8, potassium 3.2, creatinine 1.39 (baseline 0.82 on 05/16/2021), lactic acid 3.3. ?GI pathogen panel and C. difficile pending collection ?Urinalysis unremarkable ?Blood culture sent ?Chest x-ray unremarkable ? ?CT head without contrast negative for acute intracranial abnormality.  ?CT cervical spine without contrast negative for evidence of acute fracture or traumatic malalignment. Multilevel degenerative changes noted.  Enlarged and heterogeneous thyroid seen. ?  ?CT left hip without contrast showed unchanged severe left hip osteoarthritis without acute osseous abnormality. ?  ?CT chest/abdomen/pelvis with contrast negative for evidence of acute traumatic injury.  New ill-defined hypodensity in the superior right liver is seen and favored to be perfusional and less likely laceration/contusion per radiology read.  New wedge-shaped hypodensity upper pole of left kidney seen concerning for renal infarct.  New focal ill-defined hypodensity in the peripheral inferior spleen may be  perfusional versus small splenic infarct. ? ?Patient was given IV fluid, IV pain meds, IV antibiotics are admitted to hospitalist service. ?Blood culture sent on admission is growing MRSA. ? ?Subjective: ?Patient was seen and examined this morning. ?Lying down in bed.  Generalized discomfort, unable to specify.  Tachycardic, on 2 L oxygen by nasal cannula ?Labs from this morning with persistent lactic acidosis ?Platelet count significantly down today to 16. ? ?Principal Problem: ?  Severe sepsis with lactic acidosis (HCC) ?Active Problems: ?  Abnormal CT scan ?  Near syncope ?  PVD (peripheral vascular disease) (HCC) ?  AKI (acute kidney injury) (HCC) ?  History of pulmonary embolus (PE) ?  Thrombocytopenia (HCC) ?  COPD (chronic obstructive pulmonary disease) (HCC) ?  Hyperlipidemia ?  Hypertension ?  PFO (patent foramen ovale) ?  Enlarged thyroid ?  Osteoarthritis of left hip ?  Sepsis due to methicillin resistant Staphylococcus aureus (MRSA) (HCC) ?  MRSA bacteremia ?  Left hip pain ?  ?Assessment and Plan: ?Severe sepsis  ?MRSA bacteremia ?-Unclear source.  Patient seems to have a small traumatic laceration/ulcer in the right ear.  Unclear if it is the source though.   ?-CT abdomen without source of infection.  Urinalysis unremarkable. ?-ID consult appreciated.  Currently on IV vancomycin. ?-TTE did not show vegetation.  Cardiology consulted for TEE ?-Patient is tachycardic, blood pressure in normal range.  But WBC count remains elevated, lactic acid remains elevated. ?-continue IV hydration ?-Repeat WBC count, lactic acid level. ?Recent Labs  ?Lab 10/11/2021 ?1700 09/29/2021 ?1901 10/19/21 ?0637 10/20/21 ?[id number]  ?WBC 18.8*  --  19.0* 19.7*  ?LATICACIDVEN 3.3* 3.2* 2.3* 2.1*  ?PROCALCITON  --   --  15.68  --   ? ?  Acute thrombocytopenia ?-Significant downtrend of platelets in 36 hours from 96 to 16 on heparin drip. 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  HIT panel sent.  Switched to argatroban drip.  Oncology consulted. ?Recent Labs  ?Lab  10/03/2021 ?1700 10/19/21 ?0637 10/20/21 ?9030  ?PLT 96* 47* 16*  ?  ?H/o DVT/PE ?H/o acute limb ischemia s/p RLE embolectomy and fasciotomy 03/2021 ?-Patient has history of DVT and PE in the past. ?-03/2021, he was hospitalized at [facility] with acute right limb ischemia and underwent RLE embolectomy and fasciotomy.  He did not have DVT or PE at that time but he was also found to have PFO and echocardiogram.  Per cardiology outpatient follow-up note from November 2022, patient was supposed to undergo TEE for possible PFO closure.  Patient did not follow-up.  But he reports compliance to Xarelto since the embolectomy in September. ? 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 ?-It is unclear at this time if these are infarcts related to septic embolism or thromboembolism.  Reports compliance to Xarelto prior to presentation. ? ?Acute on chronic pain of left hip ?-Patient has chronic osteoarthritis of left hip for which she was planned for replacement last year but could not be done because of limb ischemia. ?-After the fall at home, patient had acute worsening of left hip pain.  CT scan of left hip did not show any new finding. ?-Continue pain management for now.  If no improvement in pain in next 1 to 2 days, may need MRI. ?-At home patient was on oxycodone 15 mg every 8 hours as needed.  Continue same for now. ? ?Near syncope ?-Patient probably had orthostatic hypotension in the setting of sepsis and GI loss.   ?-Continue IV fluid.  Continue telemetry monitoring.   ? ?AKI (acute kidney injury)  ?-Baseline creatinine 0.8 from 05/2019.  Creatinine slightly elevated 1.9.  Lisinopril on hold.   ?-Creatinine improving on IV fluid.   ?Recent Labs  ?  10/20/2021 ?1700 10/19/21 ?0637 10/20/21 ?0923  ?BUN 16 16 18   ?CREATININE 1.39* 1.06 0.94  ? ?Hyponatremia ?-Mild.  Continue monitor ?Recent Labs  ?Lab  10/24/2021 ?1700 10/19/21 ?0637 10/20/21 ?10/22/21  ?NA 134* 132* 134*  ? ?Hypokalemia/hypomagnesemia/hypophosphatemia ?-Potassium and magnesium level improved with replacement.  Phosphorus level low today.  IV replacement ordered.  Continue to monitor. ?Recent Labs  ?Lab 10/14/2021 ?1700 10/19/21 ?0637 10/20/21 ?10/22/21  ?K 3.2* 3.3* 3.5  ?MG  --  1.6* 2.1  ?PHOS  --   --  1.6*  ? ?Enlarged thyroid ?-Enlarged and heterogeneous thyroid seen on CT imaging.  TSH 1.678.  Consider thyroid ultrasound when more stable. ? ?Hypertension ?-Holding home lisinopril for now. ? ?Hyperlipidemia ?-Continue atorvastatin. ? ?COPD (chronic obstructive pulmonary disease)  ?-Continue bronchodilators. ? ?BPH ?-Flomax ? ?Goals of care ?  Code Status: Full Code  ? ? ?Mobility: Needs PT evaluation after medical stability ? ?Nutritional status:  ?Body mass index is 35.87 kg/m?.  ?  ?  ? ? ? ? ?Diet:  ?Diet Order   ? ?       ?  Diet Heart Room service appropriate? Yes; Fluid consistency: Thin  Diet effective now       ?  ? ?  ?  ? ?  ? ? ?DVT prophylaxis: IV heparin drip ?  ?Antimicrobials: IV vancomycin ?Fluid: NS at 75 ?Consultants: ID ?Family Communication: None at bedside.  I asked patient  if he wants any of his family members called.  He said no. ? ?Status is: Inpatient  ? ?Continue in-hospital care because: Sepsis work-up ongoing. ?Level of care: Progressive  ? ?Dispo: The patient is from: Home ?             Anticipated d/c is to: Pending clinical course ?             Patient currently is not medically stable to d/c. ?  Difficult to place patient No ? ? ? ? ?Infusions:  ? sodium chloride 75 mL/hr at 10/20/21 0700  ? sodium chloride    ? sodium phosphate  Dextrose 5% IVPB 30 mmol (10/20/21 0842)  ? vancomycin    ? ? ?Scheduled Meds: ? atorvastatin  10 mg Oral QHS  ? fluticasone furoate-vilanterol  1 puff Inhalation Daily  ? And  ? umeclidinium bromide  1 puff Inhalation Daily  ? gabapentin  600 mg Oral TID  ? sodium chloride flush  3 mL  Intravenous Q12H  ? sodium chloride flush  3 mL Intravenous Q12H  ? tamsulosin  0.4 mg Oral Daily  ? ? ?PRN meds: ?sodium chloride, acetaminophen **OR** acetaminophen, albuterol, ondansetron **OR** ondansetron (ZOFRAN) IV,

## 2021-10-20 NOTE — Plan of Care (Signed)
?  Problem: Clinical Measurements: ?Goal: Will remain free from infection ?Outcome: Not Progressing ?Goal: Respiratory complications will improve ?Outcome: Progressing ?Goal: Cardiovascular complication will be avoided ?Outcome: Progressing ?  ?Problem: Clinical Measurements: ?Goal: Respiratory complications will improve ?Outcome: Progressing ?  ?Problem: Clinical Measurements: ?Goal: Cardiovascular complication will be avoided ?Outcome: Progressing ?  ?

## 2021-10-20 NOTE — Progress Notes (Signed)
ANTICOAGULATION CONSULT NOTE ? 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 CT with possible renal and splenic infarcts. New thrombocytopenia. ? ?No Known Allergies ? ?Patient Measurements: ?Height: 5\' 10"  (177.8 cm) ?Weight: 113.4 kg (250 lb) ?IBW/kg (Calculated) : 73 ?Heparin Dosing Weight: 97.9 ? ?Vital Signs: ?Temp: 99.2 ?F (37.3 ?C) (03/23 0348) ?Temp Source: Axillary (03/23 0348) ?BP: 142/69 (03/23 0348) ?Pulse Rate: 111 (03/23 0348) ? ?Labs: ?Recent Labs  ?  2021-11-09 ?1700 11-09-2021 ?1700 10/19/21 ?0637 10/19/21 ?1250 10/19/21 ?2124 10/20/21 ?10/22/21  ?HGB 16.8  --  16.2  --   --  14.4  ?HCT 51.1  --  47.0  --   --  42.1  ?PLT 96*  --  47*  --   --  16*  ?APTT  --    < > 43* 39* 38* 46*  ?LABPROT 17.8*  --  15.5*  --   --   --   ?INR 1.5*  --  1.2  --   --   --   ?HEPARINUNFRC  --   --  0.35  --   --  0.19*  ?CREATININE 1.39*  --  1.06  --   --   --   ? < > = values in this interval not displayed.  ? ? ? ?Estimated Creatinine Clearance: 97 mL/min (by C-G formula based on SCr of 1.06 mg/dL). ? ?Assessment: ?53 yom with a history of PVD w/ hx of acute limb ischemia s/p RLE embolectomy and fasciotomy (03/2021), COPD, history of DVT/PE on Xarelto, HTN, HLD, severe left hip osteoarthritis, PFO . Patient is presenting with near syncope, fevers, diarrhea.  CT with possible renal and splenic infarcts.  Pharmacy consulted to dose IV heparin (last Xarelto dose on 3/20 at 1800).   ? ?Being conservative with heparin dosing due to thrombocytopenia.  Will also lower goals given increased risk of bleeding.  aPTT remains sub-therapeutic at 38 sec.  Per discussion with RN, no issue with heparin infusion nor bleeding. ? ? ?3/23 AM update:  ?Heparin level low ?No need for further aPTTs ?Plts cont to trend down (47>>16) ? ?Goal of Therapy:  ?Heparin level 0.3-0.5 units/ml ?Monitor platelets by anticoagulation protocol: Yes ?  ?Plan:  ?Inc heparin to 2050 units/hr ?1400 heparin level ?May need hematology  consult for input on platelets  ? ?4/23, PharmD, BCPS ?Clinical Pharmacist ?Phone: [phone] ? ? ? ?

## 2021-10-20 NOTE — Progress Notes (Signed)
Pharmacy Antibiotic Note ? ?Jeffrey Goodwin is a 56 y.o. male admitted on 10/12/2021 with  MRSA bacteremia .  Pharmacy has been consulted for vancomycin dosing. Renal function has greatly improved and needed an increase in dosing. LA and WBC are still elevated and afebrile. ? ?Plan: ?Vancomycin 1250 mg followed by 1500 mg every 12 hours for eAUC of 560 using 0.5L VD, SCr 0.94. ?Continue to monitor renal function and s/sx of improvement. ? ?Height: 5\' 10"  (177.8 cm) ?Weight: 113.4 kg (250 lb) ?IBW/kg (Calculated) : 73 ? ?Temp (24hrs), Avg:98.7 ?F (37.1 ?C), Min:97.6 ?F (36.4 ?C), Max:100.3 ?F (37.9 ?C) ? ?Recent Labs  ?Lab 10/08/2021 ?1700 10/10/2021 ?1901 10/19/21 ?0637 10/20/21 ?10/22/21  ?WBC 18.8*  --  19.0* 19.7*  ?CREATININE 1.39*  --  1.06 0.94  ?LATICACIDVEN 3.3* 3.2* 2.3* 2.1*  ? ?  ?Estimated Creatinine Clearance: 109.4 mL/min (by C-G formula based on SCr of 0.94 mg/dL).   ? ?No Known Allergies ? ?Antibiotics this admission: ?Cefepime 3/21 >> 3/22 ?Flagyl 3/21 >> 3/22 ?Vancomycin 3/21 >> ? ?Dose adjustments: ?Increased vanc from 1500 mg IV q24hr to 1250 mg IV q12hr. ? ?Microbiology results: ?3/21 BCx: MRSA ? ?Thank you for allowing pharmacy to participate in this patient's care. ? ?4/21, PharmD ?PGY1 Pharmacy Resident ? ?Please check AMION for all [facility] pharmacy phone numbers ?After 10:00 PM call main pharmacy [phone] ? ? ? ?

## 2021-10-20 NOTE — Progress Notes (Signed)
ANTICOAGULATION CONSULT NOTE ? 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 CT with possible renal and splenic infarcts. New thrombocytopenia. ? ?No Known Allergies ? ?Patient Measurements: ?Height: 5\' 10"  (177.8 cm) ?Weight: 113.4 kg (250 lb) ?IBW/kg (Calculated) : 73 ?Heparin Dosing Weight: 97.9 ? ?Vital Signs: ?Temp: 98.8 ?F (37.1 ?C) (03/23 1129) ?Temp Source: Oral (03/23 1129) ?BP: 114/89 (03/23 1129) ?Pulse Rate: 103 (03/23 1129) ? ?Labs: ?Recent Labs  ?  09/30/2021 ?1700 10/21/2021 ?1700 10/19/21 ?0637 10/19/21 ?1250 10/19/21 ?2124 10/20/21 ?10/22/21  ?HGB 16.8  --  16.2  --   --  14.4  ?HCT 51.1  --  47.0  --   --  42.1  ?PLT 96*  --  47*  --   --  16*  ?APTT  --    < > 43* 39* 38* 46*  ?LABPROT 17.8*  --  15.5*  --   --   --   ?INR 1.5*  --  1.2  --   --   --   ?HEPARINUNFRC  --   --  0.35  --   --  0.19*  ?CREATININE 1.39*  --  1.06  --   --  0.94  ? < > = values in this interval not displayed.  ? ? ? ?Estimated Creatinine Clearance: 109.4 mL/min (by C-G formula based on SCr of 0.94 mg/dL). ? ?Assessment: ?70 yom with a history of PVD w/ hx of acute limb ischemia s/p RLE embolectomy and fasciotomy (03/2021), COPD, history of DVT/PE on Xarelto, HTN, HLD, severe left hip osteoarthritis, PFO . Patient is presenting with near syncope, fevers, diarrhea.  CT with possible renal and splenic infarcts.  Pharmacy consulted to dose IV heparin (last Xarelto dose on 3/20 at 1800).   ? ?Being conservative with heparin dosing due to thrombocytopenia.  Will also lower goals given increased risk of bleeding.  aPTT remains sub-therapeutic at 38 sec.  Per discussion with RN, no issue with heparin infusion nor bleeding. ? ?Plts 16 this am.  ? ?Goal of Therapy:  ?Heparin level 0.3-0.5 units/ml ?Monitor platelets by anticoagulation protocol: Yes ?  ?Plan:  ?Hold heparin drip with significant thrombocytopenia. Follow up with MD for restart.  ? ?Thank you for allowing pharmacy to be a part of this patient?s  care. ? ?4/20, PharmD ?Clinical Pharmacist ? ?Please check AMION for all [facility] Pharmacy numbers ?After 10:00 PM, call Main Pharmacy [phone] ? ? ?

## 2021-10-21 ENCOUNTER — Encounter (HOSPITAL_COMMUNITY): Payer: Medicare Other

## 2021-10-21 ENCOUNTER — Encounter (HOSPITAL_COMMUNITY): Admission: EM | Disposition: E | Payer: Self-pay | Source: Home / Self Care | Attending: Internal Medicine

## 2021-10-21 DIAGNOSIS — A419 Sepsis, unspecified organism: Secondary | ICD-10-CM | POA: Diagnosis not present

## 2021-10-21 DIAGNOSIS — E872 Acidosis, unspecified: Secondary | ICD-10-CM | POA: Diagnosis not present

## 2021-10-21 DIAGNOSIS — R6520 Severe sepsis without septic shock: Secondary | ICD-10-CM | POA: Diagnosis not present

## 2021-10-21 LAB — CULTURE, BLOOD (ROUTINE X 2): Special Requests: ADEQUATE

## 2021-10-21 LAB — CBC
HCT: 40 % (ref 39.0–52.0)
Hemoglobin: 14 g/dL (ref 13.0–17.0)
MCH: 29.4 pg (ref 26.0–34.0)
MCHC: 35 g/dL (ref 30.0–36.0)
MCV: 84 fL (ref 80.0–100.0)
Platelets: 10 10*3/uL — CL (ref 150–400)
RBC: 4.76 MIL/uL (ref 4.22–5.81)
RDW: 18.1 % — ABNORMAL HIGH (ref 11.5–15.5)
WBC: 18.9 10*3/uL — ABNORMAL HIGH (ref 4.0–10.5)
nRBC: 0 % (ref 0.0–0.2)

## 2021-10-21 LAB — BASIC METABOLIC PANEL
Anion gap: 8 (ref 5–15)
BUN: 26 mg/dL — ABNORMAL HIGH (ref 6–20)
CO2: 25 mmol/L (ref 22–32)
Calcium: 8.1 mg/dL — ABNORMAL LOW (ref 8.9–10.3)
Chloride: 101 mmol/L (ref 98–111)
Creatinine, Ser: 1.15 mg/dL (ref 0.61–1.24)
GFR, Estimated: >60 mL/min (ref >60)
Glucose, Bld: 117 mg/dL — ABNORMAL HIGH (ref 70–99)
Potassium: 3.7 mmol/L (ref 3.5–5.1)
Sodium: 134 mmol/L — ABNORMAL LOW (ref 135–145)

## 2021-10-21 LAB — DIC (DISSEMINATED INTRAVASCULAR COAGULATION)PANEL
D-Dimer, Quant: >20 ug/mL-FEU — ABNORMAL HIGH (ref 0.00–0.50)
Fibrinogen: 381 mg/dL (ref 210–475)
INR: 1.1 (ref 0.8–1.2)
Platelets: 9 10*3/uL — CL (ref 150–400)
Prothrombin Time: 14.1 seconds (ref 11.4–15.2)
Smear Review: NONE SEEN
aPTT: 28 seconds (ref 24–36)

## 2021-10-21 LAB — TYPE AND SCREEN
ABO/RH(D): AB POS
Antibody Screen: NEGATIVE

## 2021-10-21 LAB — ABO/RH: ABO/RH(D): AB POS

## 2021-10-21 LAB — HEPARIN INDUCED PLATELET AB (HIT ANTIBODY): Heparin Induced Plt Ab: 0.279 OD (ref 0.000–0.400)

## 2021-10-21 LAB — LACTATE DEHYDROGENASE: LDH: 441 U/L — ABNORMAL HIGH (ref 98–192)

## 2021-10-21 LAB — HEPARIN LEVEL (UNFRACTIONATED): Heparin Unfractionated: <0.1 IU/mL — ABNORMAL LOW (ref 0.30–0.70)

## 2021-10-21 SURGERY — ECHOCARDIOGRAM, TRANSESOPHAGEAL
Anesthesia: Monitor Anesthesia Care

## 2021-10-21 MED ORDER — SENNOSIDES-DOCUSATE SODIUM 8.6-50 MG PO TABS
1.0000 | ORAL_TABLET | Freq: Every day | ORAL | Status: DC
  Administered 2021-10-21 – 2021-10-23 (×3): 1 via ORAL
  Filled 2021-10-21 (×4): qty 1

## 2021-10-21 MED ORDER — POLYETHYLENE GLYCOL 3350 17 G PO PACK: 17.0000 g | PACK | Freq: Every day | ORAL | Status: DC | PRN

## 2021-10-21 MED ORDER — SODIUM CHLORIDE 0.9% IV SOLUTION: Freq: Once | INTRAVENOUS | Status: AC

## 2021-10-21 NOTE — Progress Notes (Signed)
?PROGRESS NOTE ? ?Jeffrey Goodwin  ?DOB: 01/14/1966  ?PCP: System, Provider Not In ?HBZ:[id number]  ?DOA: 10/19/2021 ? LOS: 3 days  ?Hospital Day: 4 ? ?Brief narrative: ?Jeffrey Goodwin is a 56 y.o. male with PMH significant for DVT/PE on Xarelto, hx of acute limb ischemia s/p RLE embolectomy and fasciotomy (03/2021 at [facility]) after which he was started on Xarelto, found to have PFO during that admission; COPD, HTN, HLD, severe left hip osteoarthritis. ?Patient presented to the ED on 3/21 for evaluation of 2 days of nausea, vomiting, diarrhea, subjective fever, diaphoresis leading to an episode of near syncope and fall ?Patient states when he stood up to go to the bathroom, he felt lightheaded, dizzy and fell to the ground landing on the left hip causing worsening of his chronic severe left hip osteoarthritis.  He did not pass out.  ? ?In the ED, patient was hemodynamically stable but had a temperature 100.7 ?Labs showed WBC 18.8, potassium 3.2, creatinine 1.39 (baseline 0.82 on 05/16/2021), lactic acid 3.3. ?GI pathogen panel and C. difficile pending collection ?Urinalysis unremarkable ?Blood culture sent ?Chest x-ray unremarkable ? ?CT head without contrast negative for acute intracranial abnormality.  ?CT cervical spine without contrast negative for evidence of acute fracture or traumatic malalignment. Multilevel degenerative changes noted.  Enlarged and heterogeneous thyroid seen. ?  ?CT left hip without contrast showed unchanged severe left hip osteoarthritis without acute osseous abnormality. ?  ?CT chest/abdomen/pelvis with contrast negative for evidence of acute traumatic injury.  New ill-defined hypodensity in the superior right liver is seen and favored to be perfusional and less likely laceration/contusion per radiology read.  New wedge-shaped hypodensity upper pole of left kidney seen concerning for renal infarct.  New focal ill-defined hypodensity in the peripheral inferior spleen may be  perfusional versus small splenic infarct. ? ?Patient was given IV fluid, IV pain meds, IV antibiotics are admitted to hospitalist service. ?Blood culture sent on admission grew MRSA. ? ?Subjective: ?Patient was seen and examined this morning. ?Lying down in bed.   ?Continues to have generalized discomfort, looks uneasy  ?Also more somnolent today.   ?On 3 L oxygen by nasal cannula. ?Labs from this morning with persistent leukocytosis ?Platelet count further down to 9. ? ?Principal Problem: ?  Severe sepsis with lactic acidosis (HCC) ?Active Problems: ?  Abnormal CT scan ?  Near syncope ?  PVD (peripheral vascular disease) (HCC) ?  AKI (acute kidney injury) (HCC) ?  History of pulmonary embolus (PE) ?  Thrombocytopenia (HCC) ?  COPD (chronic obstructive pulmonary disease) (HCC) ?  Hyperlipidemia ?  Hypertension ?  PFO (patent foramen ovale) ?  Enlarged thyroid ?  Osteoarthritis of left hip ?  Sepsis due to methicillin resistant Staphylococcus aureus (MRSA) (HCC) ?  MRSA bacteremia ?  Left hip pain ?  ?Assessment and Plan: ?Severe sepsis  ?MRSA bacteremia ?-Unclear source.  Patient seems to have a small traumatic laceration/ulcer in the right ear.  Unclear if it is the source though.   ?-CT abdomen without source of infection.  Urinalysis unremarkable. ?-ID consult appreciated.  Currently on IV vancomycin. ?-TTE did not show vegetation.  Cardiology consulted for TEE but unable to do at this time because of significantly low platelets. ?-Tachycardia improving.  Blood pressure in range.  Lactic acid downtrending.  WBC count remains elevated however.  Urine color looks concentrated. ?-Continue IV hydration.  Repeat lactic acid level tomorrow. ?Recent Labs  ?Lab 10/03/2021 ?1700 10/10/2021 ?1901 10/19/21 ?0637 10/20/21 ?1017 10/20/21 ?1132  09/29/2021 ?[id number]  ?WBC 18.8*  --  19.0* 19.7*  --  18.9*  ?LATICACIDVEN 3.3* 3.2* 2.3* 2.1* 2.1*  --   ?PROCALCITON  --   --  15.68  --   --   --   ? ?Acute thrombocytopenia ?-On admission,  patient was switched from Xarelto to heparin.  On subsequent labs, patient started to show significant downtrend of platelets in 3 days from 96>>16 >> 9.  Heparin drip was held yesterday.  Unable to consider alternative anticoagulation at this time because of significantly low platelets. ?-Hematology consult appreciated.  HIT panel sent. ?-1 unit of platelet transfusion ordered for today ?-Continue to monitor. ?Recent Labs  ?Lab 10/19/2021 ?1700 10/19/21 ?0637 10/20/21 ?[id number] 10/15/2021 ?[id number]  ?PLT 96* 47* 16* 9*  10*  ?  ?H/o DVT/PE ?H/o acute limb ischemia s/p RLE embolectomy and fasciotomy 03/2021 ?-Patient has history of DVT and PE in the past. ?-03/2021, he was [facility] at [facility] with acute right limb ischemia and underwent RLE embolectomy and fasciotomy.  He did not have DVT or PE at that time but he was also found to have PFO and echocardiogram.  Per cardiology outpatient follow-up note from November 2022, patient was supposed to undergo TEE for possible PFO closure.  Patient did not follow-up.  But he reports compliance to Xarelto since the embolectomy in September. ? 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 ?-It is unclear at this time if these are infarcts related to septic embolism or thromboembolism.  Reports compliance to Xarelto prior to presentation. ? ?Acute on chronic pain of left hip ?-Patient has chronic osteoarthritis of left hip for which she was planned for replacement last year but could not be done because of limb ischemia. ?-After the fall at home, patient had acute worsening of left hip pain.  CT scan of left hip did not show any new finding. ?-Continue pain management for now.  If no improvement in pain in next 1 to 2 days, may need MRI. ?-At home patient was on oxycodone 15 mg every 8 hours as needed.  Continue same for now. ? ?Near syncope ?History of  hypertension ?-Patient reportedly had near syncope prior to presentation.  It is probably due to orthostatic hypotension in the setting of sepsis and GI loss.   ?-Continue IV fluid.  Lisinopril on hold.  Continue telemetry monitoring.   ? ?Acute respiratory failure with hypoxia ?-Currently on 3 L oxygen by nasal cannula.  Not on supplemental oxygen at home. ?-May have underlying obesity hypoventilation, sleep apnea.  Also has atelectasis and improve mobility at this time. ?-Continue supplemental oxygen. ? ?AKI (acute kidney injury)  ?-Improved AKI.  Creatinine at baseline now.  Lisinopril remains on hold. ?-Creatinine improving on IV fluid.   ?Recent Labs  ?  10/17/2021 ?1700 10/19/21 ?0637 10/20/21 ?[id number] 10/08/2021 ?[id number]  ?BUN 16 16 18  26*  ?CREATININE 1.39* 1.06 0.94 1.15  ? ?Hyponatremia ?-Mild.  Continue monitor ?Recent Labs  ?Lab 10/13/2021 ?1700 10/19/21 ?0637 10/20/21 ?[id number] 10/15/2021 ?[id number]  ?NA 134* 132* 134* 134*  ? ?Hypokalemia/hypomagnesemia/hypophosphatemia ?-Potassium and magnesium level improved with replacement.  Phosphorus level low today.  IV replacement ordered.  Continue to monitor. ?Recent Labs  ?Lab 10/09/2021 ?1700 10/19/21 ?0637 10/20/21 ?[id number] 10/16/2021 ?[id number]  ?K 3.2* 3.3* 3.5 3.7  ?MG  --  1.6* 2.1  --   ?PHOS  --   --  1.6*  --   ? ?  Enlarged thyroid ?-Enlarged and heterogeneous thyroid seen on CT imaging.  TSH 1.678.  Consider thyroid ultrasound when more stable. ? ?Hyperlipidemia ?-Continue atorvastatin. ? ?COPD (chronic obstructive pulmonary disease)  ?-Continue bronchodilators. ? ?BPH ?-Flomax ? ?Goals of care ?  Code Status: Full Code  ? ? ?Mobility: Needs PT evaluation after medical stability ? ?Nutritional status:  ?Body mass index is 35.87 kg/m?.  ?  ?  ? ? ? ? ?Diet:  ?Diet Order   ? ?       ?  Diet Heart Room service appropriate? Yes with Assist; Fluid consistency: Thin  Diet effective now       ?  ? ?  ?  ? ?  ? ? ?DVT prophylaxis: IV heparin drip ?  ?Antimicrobials: IV vancomycin ?Fluid: NS  at 75 ?Consultants: ID ?Family Communication: None at bedside.  I asked patient if he wants any of his family members called.  He said no. ? ?Status is: Inpatient  ? ?Continue in-hospital care because: Sepsis work-up ong

## 2021-10-21 NOTE — Progress Notes (Signed)
Inpatient Rehabilitation Admissions Coordinator  ? ?I met with patient at bedside for assessment. We discussed expectations of a CIR admit. Patient lives in a boarding house with limited caregiver supports. I will follow his progress, but he will likely need SNF level rehab for prolonged rehab before returning to his prior living . I will follow  ? ?Danne Baxter, RN, MSN ?Rehab Admissions Coordinator ?[phone] ?10/20/2021 11:00 AM ? ?

## 2021-10-21 NOTE — Progress Notes (Addendum)
HEMATOLOGY-ONCOLOGY PROGRESS NOTE ? ?ASSESSMENT AND PLAN: ?This is a 56 year old male with a history of DVT/PE who has been taking Xarelto as well as a history of acute ischemia admitted for severe sepsis and MRSA bacteremia.  On admission, he had mild thrombocytopenia but review of labs through care everywhere demonstrated that his platelet count has been normal and even slightly elevated as recently as September/October 2022. ?Thrombocytopenia appears to be acute.  No evidence of DIC or TTP. ?HIT panel has been obtained and currently pending.  However, timing does not fit with this diagnosis. ?Acute thrombocytopenia likely due to bone marrow depression from severe sepsis and recent antibiotic usage. ?Recommend close monitoring of platelet count and transfuse for platelet count less than 10,000 or active bleeding.  Platelet count was 9000 this morning on the DIC panel and he is receiving 1 unit of platelets today. ?Recommend continuing to hold anticoagulation until platelet count is ideally 50,000, but he is at high risk for clotting and can consider resuming anticoagulation once platelet count is 30,000 or higher. ?D-dimer was noted to be elevated on the DIC panel.  Bilateral Doppler ultrasound of the lower extremities has been ordered.  These results are pending. ? ?Clenton Pare, DNP, AGPCNP-BC, AOCNP ? ?SUBJECTIVE: No bleeding reported.  He reports generalized discomfort in his buttocks and lower back.  Platelets are currently transfusing. ? ?REVIEW OF SYSTEMS:   ?Review of Systems  ?Constitutional:  Negative for chills and fever.  ?HENT: Negative.    ?Eyes: Negative.   ?Respiratory:    ?     Reports of shortness of breath today.  ?Cardiovascular: Negative.   ?Gastrointestinal: Negative.   ?Skin: Negative.   ?Endo/Heme/Allergies:   ?     No bleeding  ? ?I have reviewed the past medical history, past surgical history, social history and family history with the patient and they are unchanged from previous  note. ? ? ?PHYSICAL EXAMINATION: ? ?Vitals:  ? 10/13/2021 0740 10/19/2021 0831  ?BP: (!) 123/56   ?Pulse: (!) 105   ?Resp: (!) 22   ?Temp: 98.5 ?F (36.9 ?C)   ?SpO2: 96% 95%  ? ?Filed Weights  ? 10/26/2021 1700  ?Weight: 113.4 kg  ? ? ?Intake/Output from previous day: ?03/23 0701 - 03/24 0700 ?In: 2012.5 [P.O.:600; I.V.:652.5; IV Piggyback:760] ?Out: 2325 [Urine:2325] ? ?Physical Exam ?Vitals reviewed.  ?Constitutional:   ?   General: He is not in acute distress. ?HENT:  ?   Head: Normocephalic.  ?Eyes:  ?   General: No scleral icterus. ?Cardiovascular:  ?   Rate and Rhythm: Normal rate and regular rhythm.  ?Pulmonary:  ?   Effort: Pulmonary effort is normal. No respiratory distress.  ?Abdominal:  ?   Palpations: Abdomen is soft.  ?   Tenderness: There is no abdominal tenderness.  ?Skin: ?   General: Skin is warm and dry.  ?Neurological:  ?   Mental Status: He is alert and oriented to person, place, and time.  ? ? ?LABORATORY DATA:  ?I have reviewed the data as listed ? ?  Latest Ref Rng & Units 10/24/2021  ?  3:12 AM 10/20/2021  ?  4:45 AM 10/19/2021  ?  6:37 AM  ?CMP  ?Glucose 70 - 99 mg/dL 379   99   024    ?BUN 6 - 20 mg/dL 26   18   16     ?Creatinine 0.61 - 1.24 mg/dL   0.97   3.53    ?Sodium 135 - 145  mmol/L 134   134   132    ?Potassium 3.5 - 5.1 mmol/L 3.7   3.5   3.3    ?Chloride 98 - 111 mmol/L 101   103   98    ?CO2 22 - 32 mmol/L 25   23   23     ?Calcium 8.9 - 10.3 mg/dL 8.1   7.9   8.1    ?Total Protein 6.5 - 8.1 g/dL  5.3   6.0    ?Total Bilirubin 0.3 - 1.2 mg/dL  1.9   1.6    ?Alkaline Phos 38 - 126 U/L  87   62    ?AST 15 - 41 U/L  136   169    ?ALT 0 - 44 U/L  118   109    ? ? ?Lab Results  ?Component Value Date  ? WBC 18.9 (H) 10-31-21  ? HGB 14.0 10-31-21  ? HCT 40.0 10-31-21  ? MCV 84.0 10-31-21  ? PLT 9 (LL) 10-31-21  ? PLT 10 (LL) 10-31-21  ? NEUTROABS 18.2 (H) 10/20/2021  ? ? ?No results found for: CEA1, CEA, [id number], CA125, PSA1 ? ?CT Head Wo Contrast ? ?Result Date:  10/02/2021 ?CLINICAL DATA:  Head trauma, moderate-severe; Neck trauma, dangerous injury mechanism (Age 58-64y) EXAM: CT HEAD WITHOUT CONTRAST CT CERVICAL SPINE WITHOUT CONTRAST TECHNIQUE: Multidetector CT imaging of the head and cervical spine was performed following the standard protocol without intravenous contrast. Multiplanar CT image reconstructions of the cervical spine were also generated. RADIATION DOSE REDUCTION: This exam was performed according to the departmental dose-optimization program which includes automated exposure control, adjustment of the mA and/or kV according to patient size and/or use of iterative reconstruction technique. COMPARISON:  None. FINDINGS: CT HEAD FINDINGS Brain: No evidence of acute infarction, hemorrhage, hydrocephalus, extra-axial collection or mass lesion/mass effect. Mild patchy white matter hypodensities, nonspecific but compatible with chronic microvascular ischemic disease. Vascular: No hyperdense vessel identified. Mild calcific intracranial atherosclerosis. Skull: No acute fracture. Sinuses/Orbits: Visualized sinuses are clear. No acute orbital findings. Other: No mastoid effusions. CT CERVICAL SPINE FINDINGS Motion limited study. Alignment: Straightening of the normal cervical lordosis. Slight anterolisthesis of C4 on C5, favor degenerative in etiology given facet arthropathy at this level. Otherwise, no substantial sagittal subluxation. Skull base and vertebrae: Motion limited assessment without evidence of acute fracture Soft tissues and spinal canal: No prevertebral fluid or swelling. No visible canal hematoma. Disc levels: Multilevel degenerative disc disease with disc height loss and posterior endplate spurring. Multilevel facet and uncovertebral hypertrophy with varying degrees of neural foraminal stenosis. Upper chest: Visualized lung apices are clear. Other: Enlarged and heterogeneous thyroid. IMPRESSION: CT head: No evidence of acute intracranial abnormality.  CT cervical spine: 1. No evidence of acute fracture or traumatic malalignment on this motion limited study. 2. Multilevel degenerative change, as detailed above. 3. Enlarged and heterogeneous thyroid. Recommend thyroid ultrasound (ref: J Am Coll Radiol. 2015 Feb;12(2): 143-50). Electronically Signed   By: Feliberto HartsFrederick S Jones M.D.   On: 10/12/2021 17:26  ? ?CT Cervical Spine Wo Contrast ? ?Result Date: 10/13/2021 ?CLINICAL DATA:  Head trauma, moderate-severe; Neck trauma, dangerous injury mechanism (Age 58-64y) EXAM: CT HEAD WITHOUT CONTRAST CT CERVICAL SPINE WITHOUT CONTRAST TECHNIQUE: Multidetector CT imaging of the head and cervical spine was performed following the standard protocol without intravenous contrast. Multiplanar CT image reconstructions of the cervical spine were also generated. RADIATION DOSE REDUCTION: This exam was performed according to the departmental dose-optimization program which includes automated exposure control,  adjustment of the mA and/or kV according to patient size and/or use of iterative reconstruction technique. COMPARISON:  None. FINDINGS: CT HEAD FINDINGS Brain: No evidence of acute infarction, hemorrhage, hydrocephalus, extra-axial collection or mass lesion/mass effect. Mild patchy white matter hypodensities, nonspecific but compatible with chronic microvascular ischemic disease. Vascular: No hyperdense vessel identified. Mild calcific intracranial atherosclerosis. Skull: No acute fracture. Sinuses/Orbits: Visualized sinuses are clear. No acute orbital findings. Other: No mastoid effusions. CT CERVICAL SPINE FINDINGS Motion limited study. Alignment: Straightening of the normal cervical lordosis. Slight anterolisthesis of C4 on C5, favor degenerative in etiology given facet arthropathy at this level. Otherwise, no substantial sagittal subluxation. Skull base and vertebrae: Motion limited assessment without evidence of acute fracture Soft tissues and spinal canal: No prevertebral fluid  or swelling. No visible canal hematoma. Disc levels: Multilevel degenerative disc disease with disc height loss and posterior endplate spurring. Multilevel facet and uncovertebral hypertrophy with varying degrees of neural f

## 2021-10-21 NOTE — Care Management Important Message (Signed)
Important Message ? ?Patient Details  ?Name: Jeffrey Goodwin ?MRN: [id number] ?Date of Birth: 1966/03/05 ? ? ?Medicare Important Message Given:  Yes ? ? ? ? ?Jeffrey Goodwin  Jeffrey Goodwin ?10/27/2021, 2:14 PM ?

## 2021-10-21 NOTE — TOC Progression Note (Signed)
Transition of Care (TOC) - Progression Note  ? ? ?Patient Details  ?Name: Jeffrey Goodwin ?MRN: [id number] ?Date of Birth: 1966/06/02 ? ?Transition of Care (TOC) CM/SW Contact  ?Angelita Ingles, RN ?Phone Number:[phone] ? ?10/20/2021, 1:51 PM ? ?Clinical Narrative:    ? ?Transition of Care (TOC) Screening Note ? ? ?Patient Details  ?Name: Jeffrey Goodwin ?Date of Birth: [birth year] ? ? ?Transition of Care (TOC) CM/SW Contact:    ?Angelita Ingles, RN ?Phone Number: ?09/29/2021, 1:51 PM ? ? ? ?Transition of Care Department [facility]) has reviewed patient and acknowledges that there will be disposition needs. TOC will continue to follow.  ? ? ? ? ?  ?  ? ?Expected Discharge Plan and Services ?  ?  ?  ?  ?  ?                ?  ?  ?  ?  ?  ?  ?  ?  ?  ?  ? ? ?Social Determinants of Health (SDOH) Interventions ?  ? ?Readmission Risk Interventions ?   ? View : No data to display.  ?  ?  ?  ? ? ?

## 2021-10-21 NOTE — Progress Notes (Signed)
?   10/23/2021 0304  ?Assess: MEWS Score  ?Temp 99.7 ?F (37.6 ?C)  ?BP (!) 145/57  ?Pulse Rate (!) 111  ?ECG Heart Rate (!) 112  ?Resp 16  ?Level of Consciousness Alert  ?SpO2 96 %  ?O2 Device Nasal Cannula  ?Assess: MEWS Score  ?MEWS Temp 0  ?MEWS Systolic 0  ?MEWS Pulse 2  ?MEWS RR 0  ?MEWS LOC 0  ?MEWS Score 2  ?MEWS Score Color Yellow  ?Assess: if the MEWS score is Yellow or Red  ?Were vital signs taken at a resting state? No  ?Focused Assessment No change from prior assessment  ?Early Detection of Sepsis Score *See Row Information* High  ?MEWS guidelines implemented *See Row Information* Yes  ?Treat  ?MEWS Interventions Other (Comment)  ?Take Vital Signs  ?Increase Vital Sign Frequency  Yellow: Q 2hr X 2 then Q 4hr X 2, if remains yellow, continue Q 4hrs  ?Escalate  ?MEWS: Escalate Yellow: discuss with charge nurse/RN and consider discussing with provider and RRT  ?Notify: Charge Nurse/RN  ?Name of Charge Nurse/RN Notified Geannie Risen, RN  ?Date Charge Nurse/RN Notified 09/28/2021  ?Time Charge Nurse/RN Notified 0309  ?Document  ?Patient Outcome Other (Comment)  ? ? ?

## 2021-10-22 ENCOUNTER — Inpatient Hospital Stay (HOSPITAL_COMMUNITY): Payer: Medicare Other

## 2021-10-22 DIAGNOSIS — Z86718 Personal history of other venous thrombosis and embolism: Secondary | ICD-10-CM

## 2021-10-22 DIAGNOSIS — E872 Acidosis, unspecified: Secondary | ICD-10-CM | POA: Diagnosis not present

## 2021-10-22 DIAGNOSIS — A419 Sepsis, unspecified organism: Secondary | ICD-10-CM | POA: Diagnosis not present

## 2021-10-22 DIAGNOSIS — R6520 Severe sepsis without septic shock: Secondary | ICD-10-CM | POA: Diagnosis not present

## 2021-10-22 LAB — PREPARE PLATELET PHERESIS: Unit division: 0

## 2021-10-22 LAB — BLOOD GAS, ARTERIAL
Acid-Base Excess: 4.5 mmol/L — ABNORMAL HIGH (ref 0.0–2.0)
Bicarbonate: 28.4 mmol/L — ABNORMAL HIGH (ref 20.0–28.0)
Drawn by: 42783
O2 Saturation: 96.4 %
Patient temperature: 37
pCO2 arterial: 39 mmHg (ref 32–48)
pH, Arterial: 7.47 — ABNORMAL HIGH (ref 7.35–7.45)
pO2, Arterial: 73 mmHg — ABNORMAL LOW (ref 83–108)

## 2021-10-22 LAB — CBC WITH DIFFERENTIAL/PLATELET
Abs Immature Granulocytes: 0.54 10*3/uL — ABNORMAL HIGH (ref 0.00–0.07)
Basophils Absolute: 0.1 10*3/uL (ref 0.0–0.1)
Basophils Relative: 0 %
Eosinophils Absolute: 0 10*3/uL (ref 0.0–0.5)
Eosinophils Relative: 0 %
HCT: 38.4 % — ABNORMAL LOW (ref 39.0–52.0)
Hemoglobin: 13.2 g/dL (ref 13.0–17.0)
Immature Granulocytes: 3 %
Lymphocytes Relative: 4 %
Lymphs Abs: 0.8 10*3/uL (ref 0.7–4.0)
MCH: 28.9 pg (ref 26.0–34.0)
MCHC: 34.4 g/dL (ref 30.0–36.0)
MCV: 84.2 fL (ref 80.0–100.0)
Monocytes Absolute: 1.9 10*3/uL — ABNORMAL HIGH (ref 0.1–1.0)
Monocytes Relative: 11 %
Neutro Abs: 15.2 10*3/uL — ABNORMAL HIGH (ref 1.7–7.7)
Neutrophils Relative %: 82 %
Platelets: 16 10*3/uL — CL (ref 150–400)
RBC: 4.56 MIL/uL (ref 4.22–5.81)
RDW: 18.1 % — ABNORMAL HIGH (ref 11.5–15.5)
Smear Review: DECREASED
WBC: 18.5 10*3/uL — ABNORMAL HIGH (ref 4.0–10.5)
nRBC: 0 % (ref 0.0–0.2)

## 2021-10-22 LAB — BASIC METABOLIC PANEL
Anion gap: 8 (ref 5–15)
BUN: 29 mg/dL — ABNORMAL HIGH (ref 6–20)
CO2: 25 mmol/L (ref 22–32)
Calcium: 8.1 mg/dL — ABNORMAL LOW (ref 8.9–10.3)
Chloride: 102 mmol/L (ref 98–111)
Creatinine, Ser: 1.12 mg/dL (ref 0.61–1.24)
GFR, Estimated: >60 mL/min (ref >60)
Glucose, Bld: 107 mg/dL — ABNORMAL HIGH (ref 70–99)
Potassium: 3.7 mmol/L (ref 3.5–5.1)
Sodium: 135 mmol/L (ref 135–145)

## 2021-10-22 LAB — PHOSPHORUS: Phosphorus: 3.2 mg/dL (ref 2.5–4.6)

## 2021-10-22 LAB — BPAM PLATELET PHERESIS
Blood Product Expiration Date: 202303272359
ISSUE DATE / TIME: 202303241152
Unit Type and Rh: 5100

## 2021-10-22 MED ORDER — HYDROMORPHONE HCL 1 MG/ML IJ SOLN
1.0000 mg | INTRAMUSCULAR | Status: DC | PRN
  Administered 2021-10-22 – 2021-10-23 (×3): 1 mg via INTRAVENOUS
  Filled 2021-10-22 (×3): qty 1

## 2021-10-22 MED ORDER — SODIUM CHLORIDE 0.9% IV SOLUTION: Freq: Once | INTRAVENOUS | Status: AC

## 2021-10-22 NOTE — Progress Notes (Addendum)
Patient with fever of 102.62F. RN gave pt Tylenol 650mg  po per existing order. On call provider notified that patient continues with RED mews, tachycardia, tachypnea, and lethargy. No new orders received at this time. Will continue to follow current plan of care and monitor vitals frequently. RN also applied several ice packs to patient and decreased room temperature when Tylenol given.  ?

## 2021-10-22 NOTE — Progress Notes (Signed)
HEMATOLOGY-ONCOLOGY PROGRESS NOTE ? ?ASSESSMENT AND PLAN: ? 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  On admission, he had mild thrombocytopenia but review of labs through care everywhere demonstrated that his platelet count has been normal and even slightly elevated as recently as September/October 2022. ?Thrombocytopenia appears to be acute.  No evidence of DIC or TTP. ?HIT ab negative. ?I have discussed the plan with Dr Pola Corn, we discussed about transfusing another unit of platelets and repeat CT abdomen today to look at the evolution of the infarcts. ?Patient very sedated, likely from dilaudid, but overall lethargic, unable to tell me any history ?No obvious bleeding from any source today. ?We have discussed if there is any role for switching abx. ?If the infarcts are thought to be related to hypercoagulable state, we can consider transfusing platelets to keep a platelet count of at least 30 k and continuing anticoagulation ?Unfortunately he is very ill with multiple issues,  ?I spoke to the nurse, he said the hospitalist offered to talk to family but patient didn't want to reach out to any of the family members. ? ?SUBJECTIVE:  ? ?He is sleeping and snoring. ?Moans to pretty much palpation everywhere. ?No bleeding noted in any of the lines or foley. ? ?I have reviewed the past medical history, past surgical history, social history and family history with the patient and they are unchanged from previous note. ? ? ?PHYSICAL EXAMINATION: ? ?Vitals:  ? 10/22/21 1115 10/22/21 1132  ?BP: (!) 132/54 (!) 130/48  ?Pulse:  97  ?Resp:  20  ?Temp: 98.4 ?F (36.9 ?C) 98.4 ?F (36.9 ?C)  ?SpO2:    ? ?Filed Weights  ? 30-Oct-2021 1700  ?Weight: 250 lb (113.4 kg)  ? ? ?Intake/Output from previous day: ?03/24 0701 - 03/25 0700 ?In: 1527.5 [P.O.:1200; Blood:327.5] ?Out: 1000 [Urine:1000] ? ?Physical  Exam ?Vitals reviewed.  ?Constitutional:   ?   General: He is in acute distress.  ?   Appearance: He is ill-appearing.  ?HENT:  ?   Head: Normocephalic.  ?Eyes:  ?   General: No scleral icterus. ?Cardiovascular:  ?   Rate and Rhythm: Normal rate and regular rhythm.  ?Pulmonary:  ?   Effort: Pulmonary effort is normal. No respiratory distress.  ?   Breath sounds: Normal breath sounds.  ?Abdominal:  ?   General: There is distension.  ?   Palpations: Abdomen is soft.  ?   Tenderness: There is abdominal tenderness (tenderness hard to elicit, he moans pretty much on palpation everywhere).  ?Skin: ?   General: Skin is warm and dry.  ?   Coloration: Skin is not jaundiced.  ? ? ?LABORATORY DATA:  ?I have reviewed the data as listed ? ?  Latest Ref Rng & Units 10/22/2021  ?  3:03 AM 10/01/2021  ?  3:12 AM 10/20/2021  ?  4:45 AM  ?CMP  ?Glucose 70 - 99 mg/dL 767   341   99    ?BUN 6 - 20 mg/dL 29   26   18     ?Creatinine 0.61 - 1.24 mg/dL   9.37   9.02    ?Sodium 135 - 145 mmol/L 135   134   134    ?Potassium 3.5 - 5.1 mmol/L 3.7   3.7   3.5    ?Chloride 98 - 111 mmol/L 102   101  103    ?CO2 22 - 32 mmol/L 25   25   23     ?Calcium 8.9 - 10.3 mg/dL 8.1   8.1   7.9    ?Total Protein 6.5 - 8.1 g/dL   5.3    ?Total Bilirubin 0.3 - 1.2 mg/dL   1.9    ?Alkaline Phos 38 - 126 U/L   87    ?AST 15 - 41 U/L   136    ?ALT 0 - 44 U/L   118    ? ? ?Lab Results  ?Component Value Date  ? WBC 18.5 (H) 10/22/2021  ? HGB 13.2 10/22/2021  ? HCT 38.4 (L) 10/22/2021  ? MCV 84.2 10/22/2021  ? PLT 16 (LL) 10/22/2021  ? NEUTROABS 15.2 (H) 10/22/2021  ? ? ?No results found for: CEA1, CEA, 10/24/2021, CA125, PSA1 ? ?CT Head Wo Contrast ? ?Result Date: 15-Nov-2021 ?CLINICAL DATA:  Head trauma, moderate-severe; Neck trauma, dangerous injury mechanism (Age 47-64y) EXAM: CT HEAD WITHOUT CONTRAST CT CERVICAL SPINE WITHOUT CONTRAST TECHNIQUE: Multidetector CT imaging of the head and cervical spine was performed following the standard protocol without  intravenous contrast. Multiplanar CT image reconstructions of the cervical spine were also generated. RADIATION DOSE REDUCTION: This exam was performed according to the departmental dose-optimization program which includes automated exposure control, adjustment of the mA and/or kV according to patient size and/or use of iterative reconstruction technique. COMPARISON:  None. FINDINGS: CT HEAD FINDINGS Brain: No evidence of acute infarction, hemorrhage, hydrocephalus, extra-axial collection or mass lesion/mass effect. Mild patchy white matter hypodensities, nonspecific but compatible with chronic microvascular ischemic disease. Vascular: No hyperdense vessel identified. Mild calcific intracranial atherosclerosis. Skull: No acute fracture. Sinuses/Orbits: Visualized sinuses are clear. No acute orbital findings. Other: No mastoid effusions. CT CERVICAL SPINE FINDINGS Motion limited study. Alignment: Straightening of the normal cervical lordosis. Slight anterolisthesis of C4 on C5, favor degenerative in etiology given facet arthropathy at this level. Otherwise, no substantial sagittal subluxation. Skull base and vertebrae: Motion limited assessment without evidence of acute fracture Soft tissues and spinal canal: No prevertebral fluid or swelling. No visible canal hematoma. Disc levels: Multilevel degenerative disc disease with disc height loss and posterior endplate spurring. Multilevel facet and uncovertebral hypertrophy with varying degrees of neural foraminal stenosis. Upper chest: Visualized lung apices are clear. Other: Enlarged and heterogeneous thyroid. IMPRESSION: CT head: No evidence of acute intracranial abnormality. CT cervical spine: 1. No evidence of acute fracture or traumatic malalignment on this motion limited study. 2. Multilevel degenerative change, as detailed above. 3. Enlarged and heterogeneous thyroid. Recommend thyroid ultrasound (ref: J Am Coll Radiol. 2015 Feb;12(2): 143-50). Electronically  Signed   By: [id number] M.D.   On: 2021-11-15 17:26  ? ?CT Cervical Spine Wo Contrast ? ?Result Date: 2021-11-15 ?CLINICAL DATA:  Head trauma, moderate-severe; Neck trauma, dangerous injury mechanism (Age 73-64y) EXAM: CT HEAD WITHOUT CONTRAST CT CERVICAL SPINE WITHOUT CONTRAST TECHNIQUE: Multidetector CT imaging of the head and cervical spine was performed following the standard protocol without intravenous contrast. Multiplanar CT image reconstructions of the cervical spine were also generated. RADIATION DOSE REDUCTION: This exam was performed according to the departmental dose-optimization program which includes automated exposure control, adjustment of the mA and/or kV according to patient size and/or use of iterative reconstruction technique. COMPARISON:  None. FINDINGS: CT HEAD FINDINGS Brain: No evidence of acute infarction, hemorrhage, hydrocephalus, extra-axial collection or mass lesion/mass effect. Mild patchy white matter hypodensities, nonspecific but compatible with chronic microvascular ischemic disease. Vascular: No  hyperdense vessel identified. Mild calcific intracranial atherosclerosis. Skull: No acute fracture. Sinuses/Orbits: Visualized sinuses are clear. No acute orbital findings. Other: No mastoid effusions. CT CERVICAL SPINE FINDINGS Motion limited study. Alignment: Straightening of the normal cervical lordosis. Slight anterolisthesis of C4 on C5, favor degenerative in etiology given facet arthropathy at this level. Otherwise, no substantial sagittal subluxation. Skull base and vertebrae: Motion limited assessment without evidence of acute fracture Soft tissues and spinal canal: No prevertebral fluid or swelling. No visible canal hematoma. Disc levels: Multilevel degenerative disc disease with disc height loss and posterior endplate spurring. Multilevel facet and uncovertebral hypertrophy with varying degrees of neural foraminal stenosis. Upper chest: Visualized lung apices are clear.  Other: Enlarged and heterogeneous thyroid. IMPRESSION: CT head: No evidence of acute intracranial abnormality. CT cervical spine: 1. No evidence of acute fracture or traumatic malalignment on this motion limited study. 2. Mu

## 2021-10-22 NOTE — Progress Notes (Signed)
?   10/22/21 1918  ?Assess: MEWS Score  ?Temp 100.1 ?F (37.8 ?C)  ?BP (!) 137/53  ?Pulse Rate (!) 114  ?ECG Heart Rate (!) 114  ?Resp (!) 26  ?Level of Consciousness Responds to Voice  ?SpO2 95 %  ?O2 Device Nasal Cannula  ?O2 Flow Rate (L/min) 3 L/min  ?Assess: MEWS Score  ?MEWS Temp 0  ?MEWS Systolic 0  ?MEWS Pulse 2  ?MEWS RR 2  ?MEWS LOC 1  ?MEWS Score 5  ?MEWS Score Color Red  ?Assess: if the MEWS score is Yellow or Red  ?Were vital signs taken at a resting state? Yes  ?Focused Assessment Change from prior assessment (see assessment flowsheet)  ?Early Detection of Sepsis Score *See Row Information* Medium  ?MEWS guidelines implemented *See Row Information* Yes  ?Treat  ?MEWS Interventions Escalated (See documentation below)  ?Pain Scale 0-10  ?Pain Score 0  ?Take Vital Signs  ?Increase Vital Sign Frequency  Red: Q 1hr X 4 then Q 4hr X 4, if remains red, continue Q 4hrs  ?Escalate  ?MEWS: Escalate Red: discuss with charge nurse/RN and provider, consider discussing with RRT  ?Notify: Charge Nurse/RN  ?Name of Charge Nurse/RN Notified Gladys RN  ?Date Charge Nurse/RN Notified 10/22/21  ?Time Charge Nurse/RN Notified 0725  ?Notify: Provider  ?Provider Name/Title E. Anna Genre, NP  ?Date Provider Notified 10/22/21  ?Time Provider Notified 1932  ?Notification Type Page  ?Notification Reason Change in status  ?Provider response Evaluate remotely  ?Date of Provider Response 10/22/21  ?Time of Provider Response 1940  ?Document  ?Patient Outcome Not stable and remains on department  ?Progress note created (see row info) Yes  ? ?Patient arouses to voice, unable to answer questions but responds "yes" when asked his name. Provider paged to make aware. Patient had received IV Dilaudid around 1730 and VS trend unchanged (remains with tachycardia, tachypnea, and low grade temp). No new orders received. RN reviewed ABG results. Will continue to follow current plan of care for Red MEWS. ?

## 2021-10-22 NOTE — Progress Notes (Signed)
?PROGRESS NOTE ? ?Jeffrey Goodwin  ?DOB: 22-Apr-1966  ?PCP: System, Provider Not In ?LYY:[id number]  ?DOA: 10/13/2021 ? LOS: 4 days  ?Hospital Day: 5 ? ?Brief narrative: ?Jeffrey Goodwin is a 56 y.o. male with PMH significant for DVT/PE on Xarelto, hx of acute limb ischemia s/p RLE embolectomy and fasciotomy (03/2021 at [facility]) after which he was started on Xarelto, found to have PFO during that admission; COPD, HTN, HLD, severe left hip osteoarthritis. ?Patient presented to the ED on 3/21 for evaluation of 2 days of nausea, vomiting, diarrhea, subjective fever, diaphoresis leading to an episode of near syncope and fall ?Patient states when he stood up to go to the bathroom, he felt lightheaded, dizzy and fell to the ground landing on the left hip causing worsening of his chronic severe left hip osteoarthritis.  He did not pass out.  ? ?In the ED, patient was hemodynamically stable but had a temperature 100.7 ?Labs showed WBC 18.8, potassium 3.2, creatinine 1.39 (baseline 0.82 on 05/16/2021), lactic acid 3.3. ?CT left hip without contrast showed unchanged severe left hip osteoarthritis without acute osseous abnormality. ? CT chest/abdomen/pelvis with contrast negative for evidence of acute traumatic injury.  New ill-defined hypodensity in the superior right liver is seen and favored to be perfusional and less likely laceration/contusion per radiology read.  New wedge-shaped hypodensity upper pole of left kidney seen concerning for renal infarct.  New focal ill-defined hypodensity in the peripheral inferior spleen may be perfusional versus small splenic infarct. ? ?Patient was given IV fluid, IV pain meds, IV antibiotics are admitted to hospitalist service. ?Blood culture sent on admission grew MRSA. ? ?Subjective: ?Patient was seen and examined this morning. ?Half awake, moaning.  Opens eyes on verbal command.  Muffled voice. ?Has generalized abdominal tenderness.  Tachycardic to 110s ?Platelet at 16 after  1 unit transfusion yesterday. ? ?Principal Problem: ?  Severe sepsis with lactic acidosis (HCC) ?Active Problems: ?  Abnormal CT scan ?  Near syncope ?  PVD (peripheral vascular disease) (HCC) ?  AKI (acute kidney injury) (HCC) ?  History of pulmonary embolus (PE) ?  Thrombocytopenia (HCC) ?  COPD (chronic obstructive pulmonary disease) (HCC) ?  Hyperlipidemia ?  Hypertension ?  PFO (patent foramen ovale) ?  Enlarged thyroid ?  Osteoarthritis of left hip ?  Sepsis due to methicillin resistant Staphylococcus aureus (MRSA) (HCC) ?  MRSA bacteremia ?  Left hip pain ?  ?Assessment and Plan: ?Severe sepsis  ?MRSA bacteremia ?-Unclear source of MRSA.  Patient seems to have a small traumatic laceration/ulcer in the right ear.  Unclear if it is the source though.  No vegetation on TTE.  Unable to do TEE because of low platelets. ?-CT abdomen on admission without source of infection.  Urinalysis unremarkable. ?-ID consult appreciated.  Currently on IV vancomycin. ?-No fever.  WBC count remains elevated however. ?-Blood pressure stable but tachycardic. ?Recent Labs  ?Lab 10/03/2021 ?1700 10/14/2021 ?1901 10/19/21 ?0637 10/20/21 ?1275 10/20/21 ?1132 10/20/2021 ?1700 10/22/21 ?0303  ?WBC 18.8*  --  19.0* 19.7*  --  18.9* 18.5*  ?LATICACIDVEN 3.3* 3.2* 2.3* 2.1* 2.1*  --   --   ?PROCALCITON  --   --  15.68  --   --   --   --   ? ?Acute thrombocytopenia ?-On admission, patient was switched from Xarelto to heparin.  On subsequent labs, patient started to show significant downtrend of platelets in 3 days from 96>>16 >> 9.  Heparin drip was held on 3/23.  Unable to consider alternative anticoagulation at this time because of significantly low platelets. ?-Discussed with hematology.  1 monitor platelet transfusion today. ?-HIT antibody negative. ?- discussed with ID.  Vancomycin less likely to be the cause of thrombocytopenia.  Also no good alternative to cover disseminated MRSA.  Daptomycin does not provide adequate coverage for unclear  source.  And daptomycin also has a mentioned risk of thrombocytopenia.  We will need to continue to monitor on vancomycin for now. ?Recent Labs  ?Lab 10/26/2021 ?1700 10/19/21 ?0637 10/20/21 ?6606 10-24-2021 ?3016 10/22/21 ?0303  ?PLT 96* 47* 16* 9*  10* 16*  ?  ?Acute abdominal pain  ?Renal, hepatic and splenic infarcts ?-CT abdomen pelvis on admission showed new ill-defined hypodensities in the superior right liver, upper pole of left kidney and spleen. ?-It is unclear at this time if these are infarcts related to septic embolism or thromboembolism.  Reports compliance to Xarelto prior to presentation. ?-3/25, patient has significant generalized tenderness in his abdomen.  ?-I ordered for CT angio of abdomen and pelvis today.  Continue to monitor ? ?H/o DVT/PE ?H/o acute limb ischemia s/p RLE embolectomy and fasciotomy 03/2021 ?-Patient has history of DVT and PE in the past. ?-03/2021, he was hospitalized at [facility] with acute right limb ischemia and underwent RLE embolectomy and fasciotomy.  He did not have DVT or PE at that time but he was also found to have PFO and echocardiogram.  Per cardiology outpatient follow-up note from November 2022, patient was supposed to undergo TEE for possible PFO closure.  Patient did not follow-up.  But he reports compliance to Xarelto since the embolectomy in September. ? ?Acute on chronic pain of left hip ?-Patient has chronic osteoarthritis of left hip for which she was planned for replacement last year but could not be done because of limb ischemia. ?-After the fall at home, patient had acute worsening of left hip pain.  CT scan of left hip did not show any new finding. ?-If no improvement in pain, may need MRI. ?-At home patient was on oxycodone 15 mg every 8 hours as needed.  Continue same for now. ? ?Near syncope ?History of hypertension ?-Patient reportedly had near syncope prior to presentation.  It is probably due to orthostatic hypotension in the setting of sepsis and GI  loss.   ?-Continue IV fluid.  Lisinopril on hold.  Continue telemetry monitoring.   ? ?Acute respiratory failure with hypoxia ?-Currently on 3 L oxygen by nasal cannula.  Not on supplemental oxygen at home. ?-May have underlying obesity hypoventilation, sleep apnea.  Also has atelectasis and improve mobility at this time. ?-Continue supplemental oxygen. ? ?AKI (acute kidney injury)  ?-Improved AKI.  Creatinine at baseline now.  Lisinopril remains on hold. ?-BUN however seems to be worsening in last 3 days.  Continue to monitor. ?Recent Labs  ?  10/10/2021 ?1700 10/19/21 ?0637 10/20/21 ?0109 Oct 24, 2021 ?3235 10/22/21 ?0303  ?BUN 16 16 18  26* 29*  ?CREATININE 1.39* 1.06 0.94 1.15 1.12  ? ?Recent Labs  ?Lab 09/30/2021 ?1700 10/19/21 ?0637 10/20/21 ?5732 2021/10/24 ?2025 10/22/21 ?0303  ?NA 134* 132* 134* 134* 135  ? ?Enlarged thyroid ?-Enlarged and heterogeneous thyroid seen on CT imaging.  TSH 1.678.  Consider thyroid ultrasound when more stable. ? ?Hyperlipidemia ?-Continue atorvastatin. ? ?COPD (chronic obstructive pulmonary disease)  ?-Continue bronchodilators. ? ?BPH ?-Flomax ? ?Goals of care ?  Code Status: Full Code  ? ? ?Mobility: Needs PT evaluation after medical stability ? ?Nutritional status:  ?Body  mass index is 35.87 kg/m?.  ?  ?  ? ? ? ? ?Diet:  ?Diet Order   ? ?       ?  Diet Heart Room service appropriate? Yes with Assist; Fluid consistency: Thin  Diet effective now       ?  ? ?  ?  ? ?  ? ? ?DVT prophylaxis: IV heparin drip ?  ?Antimicrobials: IV vancomycin ?Fluid: NS at 75 to continue ?Consultants: ID ?Family Communication: None at bedside.   ? ?Status is: Inpatient  ? ?Continue in-hospital care because: Sepsis work-up ongoing. ?Level of care: Progressive  ? ?Dispo: The patient is from: Home ?             Anticipated d/c is to: Pending clinical course ?             Patient currently is not medically stable to d/c. ?  Difficult to place patient No ? ? ? ? ?Infusions:  ? sodium chloride 75 mL/hr at 10/22/21  0413  ? sodium chloride Stopped (10/22/21 0356)  ? vancomycin 1,250 mg (10/22/21 [id number])  ? ? ?Scheduled Meds: ? atorvastatin  10 mg Oral QHS  ? fluticasone furoate-vilanterol  1 puff Inhalation Daily  ? And

## 2021-10-22 NOTE — Progress Notes (Signed)
Lower extremity venous bilateral study completed.   Please see CV Proc for preliminary results.   Judia Arnott, RDMS, RVT  

## 2021-10-22 NOTE — Progress Notes (Signed)
Added heparin as an allergy in EMR per order  ?

## 2021-10-23 ENCOUNTER — Inpatient Hospital Stay (HOSPITAL_COMMUNITY): Payer: Medicare Other

## 2021-10-23 DIAGNOSIS — R6520 Severe sepsis without septic shock: Secondary | ICD-10-CM | POA: Diagnosis not present

## 2021-10-23 DIAGNOSIS — E872 Acidosis, unspecified: Secondary | ICD-10-CM | POA: Diagnosis not present

## 2021-10-23 DIAGNOSIS — A419 Sepsis, unspecified organism: Secondary | ICD-10-CM | POA: Diagnosis not present

## 2021-10-23 LAB — PREPARE PLATELET PHERESIS: Unit division: 0

## 2021-10-23 LAB — CBC WITH DIFFERENTIAL/PLATELET
Abs Immature Granulocytes: 0.72 10*3/uL — ABNORMAL HIGH (ref 0.00–0.07)
Basophils Absolute: 0.1 10*3/uL (ref 0.0–0.1)
Basophils Relative: 0 %
Eosinophils Absolute: 0 10*3/uL (ref 0.0–0.5)
Eosinophils Relative: 0 %
HCT: 36.3 % — ABNORMAL LOW (ref 39.0–52.0)
Hemoglobin: 12.8 g/dL — ABNORMAL LOW (ref 13.0–17.0)
Immature Granulocytes: 3 %
Lymphocytes Relative: 6 %
Lymphs Abs: 1.4 10*3/uL (ref 0.7–4.0)
MCH: 29.6 pg (ref 26.0–34.0)
MCHC: 35.3 g/dL (ref 30.0–36.0)
MCV: 84 fL (ref 80.0–100.0)
Monocytes Absolute: 2.7 10*3/uL — ABNORMAL HIGH (ref 0.1–1.0)
Monocytes Relative: 11 %
Neutro Abs: 19.1 10*3/uL — ABNORMAL HIGH (ref 1.7–7.7)
Neutrophils Relative %: 80 %
Platelets: 33 10*3/uL — ABNORMAL LOW (ref 150–400)
RBC: 4.32 MIL/uL (ref 4.22–5.81)
RDW: 18.1 % — ABNORMAL HIGH (ref 11.5–15.5)
WBC: 24 10*3/uL — ABNORMAL HIGH (ref 4.0–10.5)
nRBC: 0 % (ref 0.0–0.2)

## 2021-10-23 LAB — HEPATIC FUNCTION PANEL
ALT: 69 U/L — ABNORMAL HIGH (ref 0–44)
AST: 106 U/L — ABNORMAL HIGH (ref 15–41)
Albumin: 1.8 g/dL — ABNORMAL LOW (ref 3.5–5.0)
Alkaline Phosphatase: 97 U/L (ref 38–126)
Bilirubin, Direct: 3.1 mg/dL — ABNORMAL HIGH (ref 0.0–0.2)
Indirect Bilirubin: 1.8 mg/dL — ABNORMAL HIGH (ref 0.3–0.9)
Total Bilirubin: 4.9 mg/dL — ABNORMAL HIGH (ref 0.3–1.2)
Total Protein: 4.9 g/dL — ABNORMAL LOW (ref 6.5–8.1)

## 2021-10-23 LAB — VANCOMYCIN, TROUGH: Vancomycin Tr: 12 ug/mL — ABNORMAL LOW (ref 15–20)

## 2021-10-23 LAB — PROCALCITONIN: Procalcitonin: 9.02 ng/mL

## 2021-10-23 LAB — BASIC METABOLIC PANEL
Anion gap: 6 (ref 5–15)
BUN: 28 mg/dL — ABNORMAL HIGH (ref 6–20)
CO2: 27 mmol/L (ref 22–32)
Calcium: 8 mg/dL — ABNORMAL LOW (ref 8.9–10.3)
Chloride: 106 mmol/L (ref 98–111)
Creatinine, Ser: 1.23 mg/dL (ref 0.61–1.24)
GFR, Estimated: >60 mL/min (ref >60)
Glucose, Bld: 118 mg/dL — ABNORMAL HIGH (ref 70–99)
Potassium: 3.8 mmol/L (ref 3.5–5.1)
Sodium: 139 mmol/L (ref 135–145)

## 2021-10-23 LAB — BPAM PLATELET PHERESIS
Blood Product Expiration Date: 202303272359
ISSUE DATE / TIME: 202303251053
Unit Type and Rh: 8400

## 2021-10-23 LAB — VANCOMYCIN, PEAK: Vancomycin Pk: 28 ug/mL — ABNORMAL LOW (ref 30–40)

## 2021-10-23 LAB — GLUCOSE, CAPILLARY: Glucose-Capillary: 102 mg/dL — ABNORMAL HIGH (ref 70–99)

## 2021-10-23 LAB — LACTIC ACID, PLASMA: Lactic Acid, Venous: 1.9 mmol/L (ref 0.5–1.9)

## 2021-10-23 MED ORDER — IOHEXOL 350 MG/ML SOLN
100.0000 mL | Freq: Once | INTRAVENOUS | Status: AC | PRN
  Administered 2021-10-23: 100 mL via INTRAVENOUS

## 2021-10-23 NOTE — Progress Notes (Addendum)
?PROGRESS NOTE ? ?Jeffrey Goodwin  ?DOB: 11/20/1965  ?PCP: System, Provider Not In ?DVV:[id number]  ?DOA: 10/23/2021 ? LOS: 5 days  ?Hospital Day: 6 ? ?Brief narrative: ?Jeffrey Goodwin is a 56 y.o. male with PMH significant for DVT/PE on Xarelto, hx of acute limb ischemia s/p RLE embolectomy and fasciotomy (03/2021 at [facility]) after which he was started on Xarelto, found to have PFO during that admission; COPD, HTN, HLD, severe left hip osteoarthritis. ?Patient presented to the ED on 3/21 for evaluation of 2 days of nausea, vomiting, diarrhea, subjective fever, diaphoresis leading to an episode of near syncope and fall ?Patient states when he stood up to go to the bathroom, he felt lightheaded, dizzy and fell to the ground landing on the left hip causing worsening of his chronic severe left hip osteoarthritis.  He did not pass out.  ? ?In the ED, patient was hemodynamically stable but had a temperature 100.7 ?Labs showed WBC 18.8, potassium 3.2, creatinine 1.39 (baseline 0.82 on 05/16/2021), lactic acid 3.3. ?CT left hip without contrast showed unchanged severe left hip osteoarthritis without acute osseous abnormality. ? CT chest/abdomen/pelvis with contrast negative for evidence of acute traumatic injury.  New ill-defined hypodensity in the superior right liver is seen and favored to be perfusional and less likely laceration/contusion per radiology read.  New wedge-shaped hypodensity upper pole of left kidney seen concerning for renal infarct.  New focal ill-defined hypodensity in the peripheral inferior spleen may be perfusional versus small splenic infarct. ? ?Patient was given IV fluid, IV pain meds, IV antibiotics are admitted to hospitalist service. ?Blood culture sent on admission grew MRSA. ? ?Subjective: ?Patient was seen and examined this morning. ?Propped up in bed.  Looks tired.  Overall uncomfortable.  Continues to have abdominal pain and tenderness. ?Had breakthrough fever up to 102.1 this  morning. ?Vancomycin peak level low ?Ordered CT abdomen yesterday.  Not done yet. ?Labs from this morning with platelet level better at 33,000. ? ?Principal Problem: ?  Severe sepsis with lactic acidosis (HCC) ?Active Problems: ?  Abnormal CT scan ?  Near syncope ?  PVD (peripheral vascular disease) (HCC) ?  AKI (acute kidney injury) (HCC) ?  History of pulmonary embolus (PE) ?  Thrombocytopenia (HCC) ?  COPD (chronic obstructive pulmonary disease) (HCC) ?  Hyperlipidemia ?  Hypertension ?  PFO (patent foramen ovale) ?  Enlarged thyroid ?  Osteoarthritis of left hip ?  Sepsis due to methicillin resistant Staphylococcus aureus (MRSA) (HCC) ?  MRSA bacteremia ?  Left hip pain ?  ?Assessment and Plan: ?Severe sepsis  ?MRSA bacteremia ?-Unclear source of MRSA.  Patient seems to have a small traumatic laceration/ulcer in the right ear.  Unclear if it is the source though.  No vegetation on TTE.  Unable to do TEE because of low platelets. ?-CT abdomen on admission without source of infection.  Urinalysis unremarkable. ?-ID consult appreciated.  Currently on IV vancomycin.  Had breakthrough fever of 102.7 last night.  WBC count rising up.  Vancomycin peak level low.  I sent a message to pharmacy this morning for dose adjustment. ?-Blood pressure stable but remains tachycardic. ?Recent Labs  ?Lab 10/19/2021 ?1901 10/19/21 ?0637 10/20/21 ?6269 10/20/21 ?1132 10/13/2021 ?4854 10/22/21 ?0303 10/23/21 ?0006  ?WBC  --  19.0* 19.7*  --  18.9* 18.5* 24.0*  ?LATICACIDVEN 3.2* 2.3* 2.1* 2.1*  --   --  1.9  ?PROCALCITON  --  15.68  --   --   --   --   --   ? ? ?  Acute thrombocytopenia ?-On admission, patient was switched from Xarelto to heparin.  On subsequent labs, patient started to show significant downtrend of platelets in 3 days from 96>>16 >> 9.  Heparin drip was held on 3/23.  Unable to consider alternative anticoagulation at this time because of significantly low platelets. ?-3/25, discussed with hematology.  Second unit of  platelet given. ?-HIT antibody negative. ?-3/25, discussed with ID.  Vancomycin less likely to be the cause of thrombocytopenia.  Also no good alternative to cover disseminated MRSA.  Daptomycin does not provide adequate coverage for unclear source.  And daptomycin also has a mentioned risk of thrombocytopenia.  We will need to continue to monitor on vancomycin for now. ?-Platelet level better at 33,000 today. ?Recent Labs  ?Lab 10/16/2021 ?1700 10/19/21 ?0637 10/20/21 ?[id number] 02/01/2022 ?[id number] 10/22/21 ?0303 10/23/21 ?0006  ?PLT 96* 47* 16* 9*  10* 16* 33*  ? ?  ?Acute abdominal pain  ?Renal, hepatic and splenic infarcts ?-CT abdomen pelvis on admission showed new ill-defined hypodensities in the superior right liver, upper pole of left kidney and spleen. ?-It is unclear at this time if these are infarcts related to septic embolism or thromboembolism.  Reports compliance to Xarelto prior to presentation. ?-3/25, patient has significant generalized tenderness in his abdomen.  ?-I ordered for CT angio of abdomen and pelvis yesterday.  Getting done today. ? ?H/o DVT/PE ?H/o acute limb ischemia s/p RLE embolectomy and fasciotomy 03/2021 ?-Patient has history of DVT and PE in the past. ?-03/2021, he was hospitalized at [facility] with acute right limb ischemia and underwent RLE embolectomy and fasciotomy.  He did not have DVT or PE at that time but he was also found to have PFO and echocardiogram.  Per cardiology outpatient follow-up note from November 2022, patient was supposed to undergo TEE for possible PFO closure.  Patient did not follow-up.  But he reports compliance to Xarelto since the embolectomy in September. ? ?Acute on chronic pain of left hip ?-Patient has chronic osteoarthritis of left hip for which she was planned for replacement last year but could not be done because of limb ischemia. ?-After the fall at home, patient had acute worsening of left hip pain.  CT scan of left hip did not show any new finding. ?-If no  improvement in pain, may need MRI. ?-At home patient was on oxycodone 15 mg every 8 hours as needed.  Continue same for now. ? ?Near syncope ?History of hypertension ?-Patient reportedly had near syncope prior to presentation.  It is probably due to orthostatic hypotension in the setting of sepsis and GI loss.   ?-Continue IV fluid.  Lisinopril on hold.  Continue telemetry monitoring.   ? ?Acute respiratory failure with hypoxia ?-Currently on 3 L oxygen by nasal cannula.  Not on supplemental oxygen at home. ?-May have underlying obesity hypoventilation, sleep apnea.  Also has atelectasis and improve mobility at this time. ?-Continue supplemental oxygen. ? ?AKI (acute kidney injury)  ?-Improved AKI.  Creatinine at baseline now.  Lisinopril remains on hold. ?-BUN however seems to be worsening in last 3 days.  Continue to monitor. ?Recent Labs  ?  10/16/2021 ?1700 10/19/21 ?0637 10/20/21 ?[id number] 02/01/2022 ?[id number] 10/22/21 ?0303 10/23/21 ?0006  ?BUN 16 16 18  26* 29* 28*  ?CREATININE 1.39* 1.06 0.94 1.15 1.12 1.23  ? ? ? ?Enlarged thyroid ?-Enlarged and heterogeneous thyroid seen on CT imaging.  TSH 1.678.  Consider thyroid ultrasound when more stable. ? ?Hyperlipidemia ?-Continue atorvastatin. ? ?COPD (chronic obstructive pulmonary  disease)  ?-Continue bronchodilators. ? ?BPH ?-Flomax ? ?Goals of care ?  Code Status: Full Code  ? ? ?Mobility: Needs PT evaluation after medical stability ? ?Nutritional status:  ?Body mass index is 35.87 kg/m?.  ?  ?  ? ? ? ? ?Diet:  ?Diet Order   ? ?       ?  Diet Heart Room service appropriate? Yes with Assist; Fluid consistency: Thin  Diet effective now       ?  ? ?  ?  ? ?  ? ? ?DVT prophylaxis: IV heparin drip ?  ?Antimicrobials: IV vancomycin ?Fluid: NS at 75 to continue ?Consultants: ID ?Family Communication: None at bedside.  I called and updated patient's sister Ms. Sloan this afternoon.  She states that patient had a difficult relationship with his wife.  He has a total of 7 kids and he  is not in touch with any of them or his wife.  Patient sister does not have the legal papers or power of attorney.  She states she will be in touch with the case and see if anyone wants to be the medical powe

## 2021-10-23 NOTE — Progress Notes (Signed)
Pharmacy Antibiotic Note ? ?Urban Naval is a 56 y.o. male admitted on 11/11/21 with  MRSA bacteremia .  Pharmacy has been consulted for vancomycin dosing. ? ?Vancomycin pk 28, trough 12 ?Calculated AUC 497 (at goal of 400-550) ?Noted SCr up a bit - will f/u trend and may need more levels if continues to increase ? ?Plan: ?Continue Vancomycin 1250 mg every 12 hours  ?Continue to monitor renal function and vanc levels at least weekly ? ?Height: 5\' 10"  (177.8 cm) ?Weight: 113.4 kg (250 lb) ?IBW/kg (Calculated) : 73 ? ?Temp (24hrs), Avg:99.4 ?F (37.4 ?C), Min:97.7 ?F (36.5 ?C), Max:102.1 ?F (38.9 ?C) ? ?Recent Labs  ?Lab 11-Nov-2021 ?1901 10/19/21 ?0637 10/20/21 ?10/22/21 10/20/21 ?1132 10/13/2021 ?10/23/21 10/22/21 ?0303 10/23/21 ?0006 10/23/21 ?10/01/2021  ?WBC  --  19.0* 19.7*  --  18.9* 18.5* 24.0*  --   ?CREATININE  --  1.06 0.94  --  1.15 1.12 1.23  --   ?LATICACIDVEN 3.2* 2.3* 2.1* 2.1*  --   --  1.9  --   ?VANCOTROUGH  --   --   --   --   --   --   --  12*  ?VANCOPEAK  --   --   --   --   --   --  28*  --   ? ?  ?Estimated Creatinine Clearance: 83.6 mL/min (by C-G formula based on SCr of 1.23 mg/dL).   ? ?No Active Allergies ? ?Antibiotics this admission: ?Cefepime 3/21 >> 3/22 ?Flagyl 3/21 >> 3/22 ?Vancomycin 3/21 >> ? ?Dose adjustments: ?Increased vanc from 1500 mg IV q24hr to 1250 mg IV q12hr ?VP 28, VT 12 - calc AUC ~500 - continue 1250mg  IV q12h) ? ?Microbiology results: ?3/21 BCx: MRSA ? ?Thank you for allowing pharmacy to participate in this patient's care. ? ? , PharmD, BCPS ?Please see amion for complete clinical pharmacist phone list ?10/23/2021 12:10 PM ? ? ? ? ?

## 2021-10-24 ENCOUNTER — Inpatient Hospital Stay (HOSPITAL_COMMUNITY): Payer: Medicare Other

## 2021-10-24 DIAGNOSIS — D735 Infarction of spleen: Secondary | ICD-10-CM

## 2021-10-24 DIAGNOSIS — N179 Acute kidney failure, unspecified: Secondary | ICD-10-CM

## 2021-10-24 DIAGNOSIS — N280 Ischemia and infarction of kidney: Secondary | ICD-10-CM

## 2021-10-24 DIAGNOSIS — D696 Thrombocytopenia, unspecified: Secondary | ICD-10-CM

## 2021-10-24 LAB — BASIC METABOLIC PANEL
Anion gap: 9 (ref 5–15)
BUN: 42 mg/dL — ABNORMAL HIGH (ref 6–20)
CO2: 26 mmol/L (ref 22–32)
Calcium: 8.2 mg/dL — ABNORMAL LOW (ref 8.9–10.3)
Chloride: 109 mmol/L (ref 98–111)
Creatinine, Ser: 1.42 mg/dL — ABNORMAL HIGH (ref 0.61–1.24)
GFR, Estimated: 58 mL/min — ABNORMAL LOW (ref >60)
Glucose, Bld: 116 mg/dL — ABNORMAL HIGH (ref 70–99)
Potassium: 4.3 mmol/L (ref 3.5–5.1)
Sodium: 144 mmol/L (ref 135–145)

## 2021-10-24 LAB — POCT I-STAT 7, (LYTES, BLD GAS, ICA,H+H)
Acid-Base Excess: 6 mmol/L — ABNORMAL HIGH (ref 0.0–2.0)
Bicarbonate: 32.6 mmol/L — ABNORMAL HIGH (ref 20.0–28.0)
Calcium, Ion: 1.14 mmol/L — ABNORMAL LOW (ref 1.15–1.40)
HCT: 38 % — ABNORMAL LOW (ref 39.0–52.0)
Hemoglobin: 12.9 g/dL — ABNORMAL LOW (ref 13.0–17.0)
O2 Saturation: 100 %
Patient temperature: 103
Potassium: 4.3 mmol/L (ref 3.5–5.1)
Sodium: 145 mmol/L (ref 135–145)
TCO2: 34 mmol/L — ABNORMAL HIGH (ref 22–32)
pCO2 arterial: 62.2 mmHg — ABNORMAL HIGH (ref 32–48)
pH, Arterial: 7.339 — ABNORMAL LOW (ref 7.35–7.45)
pO2, Arterial: 286 mmHg — ABNORMAL HIGH (ref 83–108)

## 2021-10-24 LAB — CBC WITH DIFFERENTIAL/PLATELET
Abs Immature Granulocytes: 1.12 10*3/uL — ABNORMAL HIGH (ref 0.00–0.07)
Basophils Absolute: 0.1 10*3/uL (ref 0.0–0.1)
Basophils Relative: 0 %
Eosinophils Absolute: 0 10*3/uL (ref 0.0–0.5)
Eosinophils Relative: 0 %
HCT: 36 % — ABNORMAL LOW (ref 39.0–52.0)
Hemoglobin: 12.4 g/dL — ABNORMAL LOW (ref 13.0–17.0)
Immature Granulocytes: 4 %
Lymphocytes Relative: 9 %
Lymphs Abs: 2.5 10*3/uL (ref 0.7–4.0)
MCH: 29 pg (ref 26.0–34.0)
MCHC: 34.4 g/dL (ref 30.0–36.0)
MCV: 84.3 fL (ref 80.0–100.0)
Monocytes Absolute: 2.4 10*3/uL — ABNORMAL HIGH (ref 0.1–1.0)
Monocytes Relative: 8 %
Neutro Abs: 22.2 10*3/uL — ABNORMAL HIGH (ref 1.7–7.7)
Neutrophils Relative %: 79 %
Platelets: 65 10*3/uL — ABNORMAL LOW (ref 150–400)
RBC: 4.27 MIL/uL (ref 4.22–5.81)
RDW: 18.2 % — ABNORMAL HIGH (ref 11.5–15.5)
Smear Review: DECREASED
WBC: 28.3 10*3/uL — ABNORMAL HIGH (ref 4.0–10.5)
nRBC: 0.2 % (ref 0.0–0.2)

## 2021-10-24 LAB — GLUCOSE, CAPILLARY
Glucose-Capillary: 111 mg/dL — ABNORMAL HIGH (ref 70–99)
Glucose-Capillary: 110 mg/dL — ABNORMAL HIGH (ref 70–99)
Glucose-Capillary: 116 mg/dL — ABNORMAL HIGH (ref 70–99)
Glucose-Capillary: 127 mg/dL — ABNORMAL HIGH (ref 70–99)

## 2021-10-24 LAB — PROCALCITONIN: Procalcitonin: 9.58 ng/mL

## 2021-10-24 LAB — HEPATIC FUNCTION PANEL
ALT: 59 U/L — ABNORMAL HIGH (ref 0–44)
AST: 102 U/L — ABNORMAL HIGH (ref 15–41)
Albumin: 1.6 g/dL — ABNORMAL LOW (ref 3.5–5.0)
Alkaline Phosphatase: 96 U/L (ref 38–126)
Bilirubin, Direct: 3.2 mg/dL — ABNORMAL HIGH (ref 0.0–0.2)
Indirect Bilirubin: 1.8 mg/dL — ABNORMAL HIGH (ref 0.3–0.9)
Total Bilirubin: 5 mg/dL — ABNORMAL HIGH (ref 0.3–1.2)
Total Protein: 5.4 g/dL — ABNORMAL LOW (ref 6.5–8.1)

## 2021-10-24 MED ORDER — NOREPINEPHRINE 4 MG/250ML-% IV SOLN
INTRAVENOUS | Status: AC
  Filled 2021-10-24: qty 250

## 2021-10-24 MED ORDER — CHLORHEXIDINE GLUCONATE CLOTH 2 % EX PADS
6.0000 | MEDICATED_PAD | Freq: Every day | CUTANEOUS | Status: DC
  Administered 2021-10-24 – 2021-10-25 (×2): 6 via TOPICAL

## 2021-10-24 MED ORDER — ETOMIDATE 2 MG/ML IV SOLN
INTRAVENOUS | Status: AC
  Administered 2021-10-24: 20 mg
  Filled 2021-10-24: qty 20

## 2021-10-24 MED ORDER — GABAPENTIN 600 MG PO TABS
600.0000 mg | ORAL_TABLET | Freq: Three times a day (TID) | ORAL | Status: DC
  Administered 2021-10-24 – 2021-10-25 (×2): 600 mg
  Filled 2021-10-24 (×4): qty 1

## 2021-10-24 MED ORDER — FENTANYL CITRATE (PF) 100 MCG/2ML IJ SOLN
50.0000 ug | INTRAMUSCULAR | Status: DC | PRN
  Administered 2021-10-24: 100 ug via INTRAVENOUS
  Filled 2021-10-24 (×2): qty 2

## 2021-10-24 MED ORDER — CHLORHEXIDINE GLUCONATE 0.12% ORAL RINSE (MEDLINE KIT)
15.0000 mL | Freq: Two times a day (BID) | OROMUCOSAL | Status: DC
  Administered 2021-10-24 – 2021-10-25 (×2): 15 mL via OROMUCOSAL

## 2021-10-24 MED ORDER — ORAL CARE MOUTH RINSE
15.0000 mL | OROMUCOSAL | Status: DC
  Administered 2021-10-24 – 2021-10-25 (×12): 15 mL via OROMUCOSAL

## 2021-10-24 MED ORDER — ONDANSETRON HCL 4 MG PO TABS: 4.0000 mg | ORAL_TABLET | Freq: Four times a day (QID) | ORAL | Status: DC | PRN

## 2021-10-24 MED ORDER — POLYETHYLENE GLYCOL 3350 17 G PO PACK
17.0000 g | PACK | Freq: Every day | ORAL | Status: DC
  Administered 2021-10-25: 17 g
  Filled 2021-10-24: qty 1

## 2021-10-24 MED ORDER — BUDESONIDE 0.5 MG/2ML IN SUSP
0.5000 mg | Freq: Two times a day (BID) | RESPIRATORY_TRACT | Status: DC
  Administered 2021-10-24 – 2021-10-25 (×3): 0.5 mg via RESPIRATORY_TRACT
  Filled 2021-10-24 (×3): qty 2

## 2021-10-24 MED ORDER — FENTANYL CITRATE PF 50 MCG/ML IJ SOSY: 50.0000 ug | PREFILLED_SYRINGE | INTRAMUSCULAR | Status: DC | PRN

## 2021-10-24 MED ORDER — REVEFENACIN 175 MCG/3ML IN SOLN
175.0000 ug | Freq: Every day | RESPIRATORY_TRACT | Status: DC
  Administered 2021-10-25: 175 ug via RESPIRATORY_TRACT
  Filled 2021-10-24 (×3): qty 3

## 2021-10-24 MED ORDER — DOCUSATE SODIUM 50 MG/5ML PO LIQD
100.0000 mg | Freq: Two times a day (BID) | ORAL | Status: DC
  Administered 2021-10-24 – 2021-10-25 (×2): 100 mg
  Filled 2021-10-24 (×2): qty 10

## 2021-10-24 MED ORDER — AMPICILLIN-SULBACTAM SODIUM 3 (2-1) G IJ SOLR
3.0000 g | Freq: Four times a day (QID) | INTRAMUSCULAR | Status: DC
  Administered 2021-10-24 – 2021-10-25 (×5): 3 g via INTRAVENOUS
  Filled 2021-10-24 (×6): qty 8

## 2021-10-24 MED ORDER — OXYCODONE HCL 5 MG PO TABS: 15.0000 mg | ORAL_TABLET | Freq: Three times a day (TID) | ORAL | Status: DC | PRN

## 2021-10-24 MED ORDER — ROCURONIUM BROMIDE 10 MG/ML (PF) SYRINGE
PREFILLED_SYRINGE | INTRAVENOUS | Status: AC
  Administered 2021-10-24: 80 mg
  Filled 2021-10-24: qty 10

## 2021-10-24 MED ORDER — SENNOSIDES-DOCUSATE SODIUM 8.6-50 MG PO TABS
1.0000 | ORAL_TABLET | Freq: Every day | ORAL | Status: DC
  Administered 2021-10-24: 1

## 2021-10-24 MED ORDER — VASOPRESSIN 20 UNITS/100 ML INFUSION FOR SHOCK
0.0000 [IU]/min | INTRAVENOUS | Status: DC
  Administered 2021-10-25: 0.03 [IU]/min via INTRAVENOUS
  Filled 2021-10-24 (×2): qty 100

## 2021-10-24 MED ORDER — PROPOFOL 1000 MG/100ML IV EMUL
0.0000 ug/kg/min | INTRAVENOUS | Status: DC
  Administered 2021-10-24: 10 ug/kg/min via INTRAVENOUS
  Administered 2021-10-24 – 2021-10-25 (×2): 20 ug/kg/min via INTRAVENOUS
  Filled 2021-10-24 (×5): qty 100

## 2021-10-24 MED ORDER — FENTANYL CITRATE (PF) 100 MCG/2ML IJ SOLN
INTRAMUSCULAR | Status: AC
  Administered 2021-10-24: 100 ug
  Filled 2021-10-24: qty 2

## 2021-10-24 MED ORDER — HEPARIN (PORCINE) 25000 UT/250ML-% IV SOLN
1900.0000 [IU]/h | INTRAVENOUS | Status: DC
  Filled 2021-10-24: qty 250

## 2021-10-24 MED ORDER — ACETAMINOPHEN 650 MG RE SUPP: 650.0000 mg | Freq: Four times a day (QID) | RECTAL | Status: DC | PRN

## 2021-10-24 MED ORDER — ONDANSETRON HCL 4 MG/2ML IJ SOLN: 4.0000 mg | Freq: Four times a day (QID) | INTRAMUSCULAR | Status: DC | PRN

## 2021-10-24 MED ORDER — FAMOTIDINE IN NACL 20-0.9 MG/50ML-% IV SOLN
20.0000 mg | Freq: Two times a day (BID) | INTRAVENOUS | Status: AC
  Administered 2021-10-24 – 2021-10-25 (×2): 20 mg via INTRAVENOUS
  Filled 2021-10-24 (×2): qty 50

## 2021-10-24 MED ORDER — NOREPINEPHRINE 4 MG/250ML-% IV SOLN
0.0000 ug/min | INTRAVENOUS | Status: DC
  Administered 2021-10-24: 7 ug/min via INTRAVENOUS
  Administered 2021-10-24 (×2): 10 ug/min via INTRAVENOUS
  Administered 2021-10-25: 24 ug/min via INTRAVENOUS
  Administered 2021-10-25: 22 ug/min via INTRAVENOUS
  Filled 2021-10-24 (×7): qty 250

## 2021-10-24 MED ORDER — MIDAZOLAM HCL 2 MG/2ML IJ SOLN
INTRAMUSCULAR | Status: AC
  Filled 2021-10-24: qty 2

## 2021-10-24 MED ORDER — ACETAMINOPHEN 325 MG PO TABS
650.0000 mg | ORAL_TABLET | Freq: Four times a day (QID) | ORAL | Status: DC | PRN
  Administered 2021-10-25 (×2): 650 mg
  Filled 2021-10-24 (×2): qty 2

## 2021-10-24 MED ORDER — ARFORMOTEROL TARTRATE 15 MCG/2ML IN NEBU
15.0000 ug | INHALATION_SOLUTION | Freq: Two times a day (BID) | RESPIRATORY_TRACT | Status: DC
  Administered 2021-10-24 – 2021-10-25 (×3): 15 ug via RESPIRATORY_TRACT
  Filled 2021-10-24 (×5): qty 2

## 2021-10-24 MED ORDER — ATORVASTATIN CALCIUM 10 MG PO TABS
10.0000 mg | ORAL_TABLET | Freq: Every day | ORAL | Status: DC
  Administered 2021-10-24: 10 mg

## 2021-10-24 NOTE — Procedures (Signed)
Arterial Catheter Insertion Procedure Note ? ?Jeffrey Goodwin  ?[id number]  ?06/13/1966 ? ?Date:10/24/21  ?Time:4:10 PM  ? ? ?Provider Performing: Gaylan Gerold  ? ? ?Procedure: Insertion of Arterial Line [id number]) with US guidance [id number])  ? ?Indication(s) ?Blood pressure monitoring and/or need for frequent ABGs ? ?Consent ?Risks of the procedure as well as the alternatives and risks of each were explained to the patient and/or caregiver.  Consent for the procedure was obtained and is signed in the bedside chart ? ?Anesthesia ?None ? ? ?Time Out ?Verified patient identification, verified procedure, site/side was marked, verified correct patient position, special equipment/implants available, medications/allergies/relevant history reviewed, required imaging and test results available. ? ? ?Sterile Technique ?Maximal sterile technique including full sterile barrier drape, hand hygiene, sterile gown, sterile gloves, mask, hair covering, sterile ultrasound probe cover (if used). ? ? ?Procedure Description ?Area of catheter insertion was cleaned with chlorhexidine and draped in sterile fashion. With real-time ultrasound guidance an arterial catheter was placed into the right  Brachial  artery.  Appropriate arterial tracings confirmed on monitor.   ? ? ?Complications/Tolerance ?None; patient tolerated the procedure well. ? ? ?EBL ?Minimal ? ? ?Specimen(s) ?None  ?

## 2021-10-24 NOTE — Procedures (Signed)
Bronchoscopy Procedure Note ? ?Jeffrey Goodwin  ?[id number]  ?[birth year] ? ?Date:10/24/21  ?Time:1:13 PM  ? ?Provider Performing:Kowen Kluth C Tamala Julian  ? ?Procedure(s):  Flexible bronchoscopy with bronchial alveolar lavage (05397) and Initial Therapeutic Aspiration of Tracheobronchial Tree (67341) ? ?Indication(s) ?Abnormal CT, mucus plugging ? ?Consent ?Obtained over phone ? ?Anesthesia ?In place for intubation ? ? ?Time Out ?Verified patient identification, verified procedure, site/side was marked, verified correct patient position, special equipment/implants available, medications/allergies/relevant history reviewed, required imaging and test results available. ? ? ?Sterile Technique ?Usual hand hygiene, masks, gowns, and gloves were used ? ? ?Procedure Description ?Bronchoscope advanced through endotracheal tube and into airway.  Airways were examined down to subsegmental level with findings noted below.   ?Following diagnostic evaluation, BAL(s) performed in LLL with normal saline and return of cloudy fluid and Therapeutic aspiration performed in right and left mainstems ? ?Findings:  ?- ETT in good position ?- Right and left mainstems ~40% occluded by brown mucus plugs suctioned ?- Right lung mostly clear down to subsegmental level ?- Left lower lobe had thick brown secretions, suctioned and BAL performed ? ? ?Complications/Tolerance ?None; patient tolerated the procedure well. ?Chest X-ray is not needed post procedure. ? ? ?EBL ?Minimal ? ? ?Specimen(s) ?LLL BAL ? ?

## 2021-10-24 NOTE — Progress Notes (Signed)
?   ? ?[facility] ? ?Date of Admission:  11-13-21    ?       ?Reason for visit: Follow up on bacteremia ? ?Current antibiotics: ?Vancomycin 3/21-present ? ? ? ?ASSESSMENT:   ? ?56 y.o. male admitted with: ? ?MRSA bacteremia: Unclear source and complicated by renal, hepatic, and splenic infarcts concerning for left-sided endocarditis versus thromboembolic disease.  Repeat CT angio of the abdomen and pelvis on 3/26 also noted an additional hepatic infarct versus early abscess.  No abscess noted on RUQ [place] today.  Repeat blood cultures from 3/26 remain positive. ?Multifocal pneumonia and hypoxic respiratory failure: Noted on repeat CT 3/26 and currently on 3 liters via .  ? Volume overload component as well. ?Severe sepsis: Due to #1 with continued fevers and WBC 28.3 today. ?Encephalopathy ?Elevated LFTs ?Thrombocytopenia: Improved today to 65 ?Acute kidney injury: Creatinine overall improved with slight increase today up to 1.42.  ?History of DVT/PE and acute limb ischemia: Status post RLE embolectomy and fasciotomy September 2022. ? ?RECOMMENDATIONS:   ? ?Continue vancomycin ?Repeat blood cultures ?Add Unasyn in the setting of possible new pneumonia ?Consider MRI brain given decreased mental status to rule out CNS septic emboli ?TEE pending ?Will follow ? ? ?Principal Problem: ?  Severe sepsis with lactic acidosis (HCC) ?Active Problems: ?  COPD (chronic obstructive pulmonary disease) (HCC) ?  History of pulmonary embolus (PE) ?  Hyperlipidemia ?  Hypertension ?  PFO (patent foramen ovale) ?  PVD (peripheral vascular disease) (HCC) ?  Abnormal CT scan ?  Enlarged thyroid ?  Near syncope ?  Thrombocytopenia (HCC) ?  AKI (acute kidney injury) (HCC) ?  Osteoarthritis of left hip ?  Sepsis due to methicillin resistant Staphylococcus aureus (MRSA) (HCC) ?  MRSA bacteremia ?  Left hip pain ? ? ? ?MEDICATIONS:   ? ?Scheduled Meds: ? atorvastatin  10 mg Oral QHS  ? fluticasone  furoate-vilanterol  1 puff Inhalation Daily  ? And  ? umeclidinium bromide  1 puff Inhalation Daily  ? gabapentin  600 mg Oral TID  ? senna-docusate  1 tablet Oral QHS  ? sodium chloride flush  3 mL Intravenous Q12H  ? sodium chloride flush  3 mL Intravenous Q12H  ? tamsulosin  0.4 mg Oral Daily  ? ?Continuous Infusions: ? sodium chloride Stopped (10/22/21 0356)  ? vancomycin 1,250 mg (10/24/21 0902)  ? ?PRN Meds:.sodium chloride, acetaminophen **OR** acetaminophen, albuterol, HYDROmorphone (DILAUDID) injection, ondansetron **OR** ondansetron (ZOFRAN) IV, oxyCODONE, polyethylene glycol, sodium chloride flush ? ?SUBJECTIVE:  ? ?24 hour events:  ?No acute events noted overnight ?Febrile Tmax 102.6 ?WBC increased to 28.3 ?Repeat blood cultures are positive ?CTA with continued infarcts as well as new pneumonia ? ?Patient with no new complaints.  Reports fevers.  Denies diarrhea. ? ?Review of Systems  ?All other systems reviewed and are negative. ? ?  ?OBJECTIVE:  ? ?Blood pressure (!) 109/39, pulse (!) 106, temperature 98.8 ?F (37.1 ?C), temperature source Oral, resp. rate (!) 34, height 5\' 10"  (1.778 m), weight 113.4 kg, SpO2 96 %. ?Body mass index is 35.87 kg/m?. ? ?Physical Exam ?Constitutional:   ?   Comments: Chronically ill-appearing man, lying in bed, tachypneic with decreased arousability.  ?HENT:  ?   Head: Normocephalic and atraumatic.  ?   Mouth/Throat:  ?   Comments: Dentition is poor ?Eyes:  ?   Extraocular Movements: Extraocular movements intact.  ?   Conjunctiva/sclera: Conjunctivae normal.  ?Cardiovascular:  ?  Rate and Rhythm: Normal rate and regular rhythm.  ?Pulmonary:  ?   Comments: Breath sounds are coarse bilaterally and diminished at the bases.  He is slightly tachypneic. ?Abdominal:  ?   General: There is no distension.  ?   Palpations: Abdomen is soft.  ?   Tenderness: There is no abdominal tenderness.  ?Musculoskeletal:     ?   General: Normal range of motion.  ?   Cervical back: Normal range  of motion and neck supple.  ?Skin: ?   General: Skin is warm and dry.  ?   Findings: No rash.  ?Neurological:  ?   General: No focal deficit present.  ?   Mental Status: He is oriented to person, place, and time.  ?Psychiatric:     ?   Mood and Affect: Mood normal.     ?   Behavior: Behavior normal.  ? ? ? ?Lab Results: ?Lab Results  ?Component Value Date  ? WBC 28.3 (H) 10/24/2021  ? HGB 12.4 (L) 10/24/2021  ? HCT 36.0 (L) 10/24/2021  ? MCV 84.3 10/24/2021  ? PLT 65 (L) 10/24/2021  ?  ?Lab Results  ?Component Value Date  ? NA 144 10/24/2021  ? K 4.3 10/24/2021  ? CO2 26 10/24/2021  ? GLUCOSE 116 (H) 10/24/2021  ? BUN 42 (H) 10/24/2021  ? CREATININE 1.42 (H) 10/24/2021  ? CALCIUM 8.2 (L) 10/24/2021  ? GFRNONAA 58 (L) 10/24/2021  ?  ?Lab Results  ?Component Value Date  ? ALT 59 (H) 10/24/2021  ? AST 102 (H) 10/24/2021  ? ALKPHOS 96 10/24/2021  ? BILITOT 5.0 (H) 10/24/2021  ? ? ?No results found for: CRP ? ?No results found for: ESRSEDRATE ?  ?I have reviewed the micro and lab results in Epic. ? ?Imaging: ?VAS [place] LOWER EXTREMITY VENOUS (DVT) ? ?Result Date: 10/23/2021 ? Lower Venous DVT Study Patient Name:  Jeffrey Goodwin  Date of Exam:   10/22/2021 Medical Rec #: [id number]              Accession #:    [id number] Date of Birth: 11-14-1965              Patient Gender: M Patient Age:   69 years Exam Location:  [facility] Procedure:      VAS [place] LOWER EXTREMITY VENOUS (DVT) Referring Phys: Clenton Pare --------------------------------------------------------------------------------  Indications: History of DVT/PE.  Risk Factors: Surgery RLE embolectomy and fasciotomy at outside facility on 03/2021. Anticoagulation: Transitioned from Xarelto to Heparin; however, significant downtrend of platelets. Limitations: Poor ultrasound/tissue interface secondary to skin changes/incision right calf. Comparison Study: No prior studies. Performing Technologist: Jean Rosenthal RDMS, RVT  Examination Guidelines: A complete  evaluation includes B-mode imaging, spectral Doppler, color Doppler, and power Doppler as needed of all accessible portions of each vessel. Bilateral testing is considered an integral part of a complete examination. Limited examinations for reoccurring indications may be performed as noted. The reflux portion of the exam is performed with the patient in reverse Trendelenburg.  +---------+---------------+---------+-----------+----------+--------------+ RIGHT    CompressibilityPhasicitySpontaneityPropertiesThrombus Aging +---------+---------------+---------+-----------+----------+--------------+ CFV      Full           Yes      Yes                                 +---------+---------------+---------+-----------+----------+--------------+ SFJ      Full                                                        +---------+---------------+---------+-----------+----------+--------------+  FV Prox  Full                                                        +---------+---------------+---------+-----------+----------+--------------+ FV Mid   Full                                                        +---------+---------------+---------+-----------+----------+--------------+ FV DistalFull                                                        +---------+---------------+---------+-----------+----------+--------------+ PFV      Full                                                        +---------+---------------+---------+-----------+----------+--------------+ POP      Full           Yes      Yes                                 +---------+---------------+---------+-----------+----------+--------------+ PTV      Full                                                        +---------+---------------+---------+-----------+----------+--------------+ PERO     Full                                                         +---------+---------------+---------+-----------+----------+--------------+ Gastroc  Full                                                        +---------+---------------+---------+-----------+----------+--------------+   +---------+---------------+---------+-----------+----------+--------------+ LEFT     CompressibilityPhasicitySpon

## 2021-10-24 NOTE — Procedures (Signed)
Intubation Procedure Note ? ?Rachel Muneton  ?[id number]  ?1966-04-18 ? ?Date:10/24/21  ?Time:2:50 PM  ? ?Provider Performing:Shalee Paolo Madia Carvell  ? ? ?Procedure: Intubation ([id number]) ? ?Indication(s) ?Respiratory Failure ? ?Consent ?Risks of the procedure as well as the alternatives and risks of each were explained to the patient and/or caregiver.  Consent for the procedure was obtained and is signed in the bedside chart ? ? ?Anesthesia ?Etomidate ? ? ?Time Out ?Verified patient identification, verified procedure, site/side was marked, verified correct patient position, special equipment/implants available, medications/allergies/relevant history reviewed, required imaging and test results available. ? ? ?Sterile Technique ?Usual hand hygeine, masks, and gloves were used ? ? ?Procedure Description ?Patient positioned in bed supine.  Sedation given as noted above.  Patient was intubated with endotracheal tube using Glidescope.  View was Grade 1 full glottis .  Number of attempts was 1.  Colorimetric CO2 detector was consistent with tracheal placement. ? ? ?Complications/Tolerance ?None; patient tolerated the procedure well. ?Chest X-ray is ordered to verify placement. ? ? ?EBL ?Minimal ? ? ?Specimen(s) ?None ? ?Timothy Lasso ?PGY1 Internal Medicine Resident ?Pager: [phone] ? ?

## 2021-10-24 NOTE — Progress Notes (Signed)
?PROGRESS NOTE ? ?Jeffrey RacerAnthony George Kraner  ?DOB: [birth year]  ?PCP: System, Provider Not In ?ZOX:[id number]  ?DOA: 10/20/2021 ? LOS: 6 days  ?Hospital Day: 7 ? ?Brief narrative: ?Jeffrey Goodwin is a 56 y.o. male with PMH significant for DVT/PE on Xarelto, hx of acute limb ischemia s/p RLE embolectomy and fasciotomy (03/2021 at [facility]) after which he was started on Xarelto, found to have PFO during that admission; COPD, HTN, HLD, severe left hip osteoarthritis. ?Patient presented to the ED on 3/21 for evaluation of 2 days of nausea, vomiting, diarrhea, subjective fever, diaphoresis leading to an episode of near syncope and fall ?Patient states when he stood up to go to the bathroom, he felt lightheaded, dizzy and fell to the ground landing on the left hip causing worsening of his chronic severe left hip osteoarthritis.  He did not pass out.  ? ?In the ED, patient was hemodynamically stable but had a temperature 100.7 ?Labs showed WBC 18.8, potassium 3.2, creatinine 1.39 (baseline 0.82 on 05/16/2021), lactic acid 3.3. ?CT left hip without contrast showed unchanged severe left hip osteoarthritis without acute osseous abnormality. ? CT chest/abdomen/pelvis with contrast negative for evidence of acute traumatic injury.  New ill-defined hypodensity in the superior right liver is seen and favored to be perfusional and less likely laceration/contusion per radiology read.  New wedge-shaped hypodensity upper pole of left kidney seen concerning for renal infarct.  New focal ill-defined hypodensity in the peripheral inferior spleen may be perfusional versus small splenic infarct. ? ?Patient was given IV fluid, IV pain meds, IV antibiotics are admitted to hospitalist service. ?Blood culture sent on admission grew MRSA. ? ?Subjective: ?Patient was seen and examined this morning. ?Propped up in bed.  Tachypneic to 30s.  Shallow respiratory effort.  Opens eyes and verbal, muffled voice. ?Dark urine in canister.  Seems to be  getting anasarca ?Chart reviewed.  Continues to spike fever despite being on IV vancomycin ?Platelet level improved on blood work this morning.  Heparin drip resumed per discussion with hematology today. ?No family at bedside. ?I called for critical care consult because patient seems to be having impending respiratory failure. ? ?Principal Problem: ?  Severe sepsis with lactic acidosis (HCC) ?Active Problems: ?  Abnormal CT scan ?  Near syncope ?  PVD (peripheral vascular disease) (HCC) ?  AKI (acute kidney injury) (HCC) ?  History of pulmonary embolus (PE) ?  Thrombocytopenia (HCC) ?  COPD (chronic obstructive pulmonary disease) (HCC) ?  Hyperlipidemia ?  Hypertension ?  PFO (patent foramen ovale) ?  Enlarged thyroid ?  Osteoarthritis of left hip ?  Sepsis due to methicillin resistant Staphylococcus aureus (MRSA) (HCC) ?  MRSA bacteremia ?  Left hip pain ?  ?Assessment and Plan: ?Severe sepsis  ?MRSA bacteremia ?Multifocal pneumonia. ?-Unclear source of MRSA.  Patient seems to have a small traumatic laceration/ulcer in the right ear.  Unclear if it is the source though.  No vegetation on TTE.  TEE could not be done early on because of low platelets.  Platelets level have now improved.  Consulted cardiology today again for TEE consideration.   ?-CT abdomen on admission without source of infection.  Urinalysis unremarkable. ?-ID following.  Currently on IV vancomycin.  Patient is having breakthrough fever.  ?-CT scan from 3/26 showed interval development of multifocal groundglass opacities likely due to pneumonia and interval collapse of the left lower lobe. ?-May need to broaden antibiotic coverage.  Will discuss with ID. ?Recent Labs  ?Lab 10/28/2021 ?1901 10/19/21 ?  3419 10/20/21 ?6222 10/20/21 ?1132 10/10/2021 ?9798 10/22/21 ?0303 10/23/21 ?0006 10/23/21 ?1721 10/24/21 ?0401  ?WBC  --  19.0* 19.7*  --  18.9* 18.5* 24.0*  --  28.3*  ?LATICACIDVEN 3.2* 2.3* 2.1* 2.1*  --   --  1.9  --   --   ?PROCALCITON  --  15.68  --    --   --   --   --  9.02 9.58  ? ?Acute respiratory failure with hypoxia ?-Patient has gradually worsening respiratory status.  He is tachypneic to 30s.  Has distended abdomen with significant atelectasis, poor respiratory effort from altered mentation. ?-CT scan from 3/26 also showed multifocal groundglass opacities and interval collapse of the left lower lobe. ?-With his worsening respiratory status, I am concerned that he is headed towards intubation. ?-Critical care consulted. ? ?Acute thrombocytopenia ?-Platelet level significantly dropped down to the lowest of 9.  Hematology consulted.  Suspected to be due to sepsis.  PTA, patient was on Xarelto which was switched to heparin on admission.  It was held because of low platelets.  Patient got total of 2 units of platelet transfusion.  HIT antibody panel negative. ?-3/27, discussed with hematology.  Improved platelet count to 65,000.  Will resume heparin drip today. ?-Continue to monitor ?Recent Labs  ?Lab 06-Nov-2021 ?1700 10/19/21 ?0637 10/20/21 ?9211 10/26/2021 ?9417 10/22/21 ?0303 10/23/21 ?0006 10/24/21 ?0401  ?PLT 96* 47* 16* 9*  10* 16* 33* 65*  ?  ?Acute abdominal pain  ?-Patient continues to have mild to moderate generalized abdominal tenderness.   ? ?Renal, hepatic and splenic infarcts ?-CT abdomen pelvis on admission showed new ill-defined hypodensities in the superior right liver, upper pole of left kidney and spleen. ?-Repeat CT angio of abdomen/pelvis continues to show the same finding with no other acute abnormality. ?-It is unclear at this time if these are infarcts related to septic embolism or thromboembolism.  Reports compliance to Xarelto prior to presentation. ?-Patient continues to have mild to moderate generalized abdominal tenderness ? ?H/o DVT/PE ?H/o acute limb ischemia s/p RLE embolectomy and fasciotomy 03/2021 ?-Patient has history of DVT and PE in the past. ?-03/2021, he was hospitalized at [facility] with acute right limb ischemia and  underwent RLE embolectomy and fasciotomy.  He did not have DVT or PE at that time but he was also found to have PFO and echocardiogram.  Per cardiology outpatient follow-up note from November 2022, patient was supposed to undergo TEE for possible PFO closure.  Patient did not follow-up.  But he reports compliance to Xarelto since the embolectomy in September. ? ?Acute on chronic pain of left hip ?-Patient has chronic osteoarthritis of left hip for which she was planned for replacement last year but could not be done because of limb ischemia. ?-After the fall at home, patient had acute worsening of left hip pain.  CT scan of left hip did not show any new finding. ?-If no improvement in pain, may need MRI. ?-Currently on IV and oral pain medicines. ? ?Near syncope ?History of hypertension ?-Patient reportedly had near syncope prior to presentation.  It is probably due to orthostatic hypotension in the setting of sepsis and GI loss.   ?-Continue IV fluid.  Lisinopril on hold.  Continue telemetry monitoring.   ? ?AKI (acute kidney injury)  ?-AKI initially improved with hydration but BUN and creatinine worsening now.  He is getting continuous IV fluid, having generalized anasarca but the urine is dark urine now.   ?Recent Labs  ?  11-10-2021 ?1700 10/19/21 ?5397 10/20/21 ?6734 10/10/2021 ?1937 10/22/21 ?0303 10/23/21 ?0006 10/24/21 ?0401  ?BUN 16 16 18  26* 29* 28* 42*  ?CREATININE 1.39* 1.06 0.94 1.15 1.12 1.23 1.42*  ? ?Enlarged thyroid ?-Enlarged and heterogeneous thyroid seen on CT imaging.  TSH 1.678.  Consider thyroid ultrasound when more stable. ? ?Hyperlipidemia ?-Continue atorvastatin. ? ?COPD (chronic obstructive pulmonary disease)  ?-Continue bronchodilators. ? ?BPH ?-Flomax ? ?Goals of care ?  Code Status: Full Code  ? ? ?Mobility: Needs PT evaluation after medical stability ? ?Nutritional status:  ?Body mass index is 35.87 kg/m?.  ?  ?  ? ? ? ? ?Diet:  ?Diet Order   ? ?       ?  Diet Heart Room service  appropriate? Yes with Assist; Fluid consistency: Thin  Diet effective now       ?  ? ?  ?  ? ?  ? ? ?DVT prophylaxis: IV heparin drip ?  ?Antimicrobials: IV vancomycin ?Fluid: NS at 75 to continue ?Consultants: ID ?Family C

## 2021-10-24 NOTE — Plan of Care (Signed)
?  Interdisciplinary Goals of Care Family Meeting ? ? ?Date carried out:: 10/24/2021 ? ?Location of the meeting: Phone conference ? ?Member's involved: Nurse Practitioner and Family Member or next of kin ? ?Durable Power of Attorney or acting medical decision maker: Psychologist, prison and probation services - Sister POA    ? ?Discussion: We discussed goals of care for Jeffrey Goodwin .  I called Tiny due to decompensation and need for transfer to ICU. She was informed that [name] is now having respiratory distress likely due to aspiration and he will need to be intubated and placed on mechanical ventilator.  ? ?She agreed to full aggressive care including  intubation and confirmed that patient is a full code.  ? ?Code status: Full Code ? ?Disposition: Continue current acute care ? ?Time spent for the meeting: 15 mins ? ? ?Gyneth Hubka D. Harris, NP-C ?[facility] ?Personal contact information can be found on Amion  ?10/24/2021, 12:39 PM ? ? ?

## 2021-10-24 NOTE — Progress Notes (Signed)
PT Cancellation Note ? ?Patient Details ?Name: Jeffrey Goodwin Needs ?MRN: [id number] ?DOB: 1966/03/29 ? ? ?Cancelled Treatment:    Reason Eval/Treat Not Completed: Medical issues which prohibited therapy. MD requested hold PT today. Pt likely going to ICU. Will check on tomorrow.  ? ?Lyanne Co, PT  ?Acute Rehab Services ? Pager [phone] ?Office [phone] ? ? ? ?Amritpal Shropshire L Reilley Valentine ?10/24/2021, 12:11 PM ?

## 2021-10-24 NOTE — Progress Notes (Signed)
ANTICOAGULATION CONSULT NOTE ? 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 CT with possible renal and splenic infarcts. New thrombocytopenia. ? ?No Active Allergies ? ?Patient Measurements: ?Height: 5\' 10"  (177.8 cm) ?Weight: 113.4 kg (250 lb) ?IBW/kg (Calculated) : 73 ?Heparin Dosing Weight: 97.9 ? ?Vital Signs: ?Temp: 98.8 ?F (37.1 ?C) (03/27 0804) ?Temp Source: Oral (03/27 0804) ?BP: 109/39 (03/27 0804) ?Pulse Rate: 106 (03/27 0856) ? ?Labs: ?Recent Labs  ?  10/22/21 ?0303 10/23/21 ?0006 10/24/21 ?0401  ?HGB 13.2 12.8* 12.4*  ?HCT 38.4* 36.3* 36.0*  ?PLT 16* 33* 65*  ?CREATININE 1.12 1.23 1.42*  ? ? ? ?Estimated Creatinine Clearance: 72.4 mL/min (A) (by C-G formula based on SCr of 1.42 mg/dL (H)). ? ?Assessment: ?23 yom with a history of PVD w/ hx of acute limb ischemia s/p RLE embolectomy and fasciotomy (03/2021), COPD, history of DVT/PE on Xarelto, HTN, HLD, severe left hip osteoarthritis, PFO . Patient is presenting with near syncope, fevers, diarrhea.  CT with possible renal and splenic infarcts.  Pharmacy consulted to dose IV heparin (last Xarelto dose on 3/20 at 1800).   ? ?Patients platelets have recovered >50 ? ? ?Goal of Therapy:  ?Heparin level 0.3-0.5 units/ml ?aPTT 66-85 seconds ?Monitor platelets by anticoagulation protocol: Yes ?  ?Plan:  ?Heparin drip at 1900 units/hr>>spoke with nurse, holding heparin drip until after CT head ?Heparin level at 1800 ?Daily heparin level and CBC ordered ?Monitor for signs/symptoms of bleed ? ?Thank you for allowing pharmacy to be a part of this patient?s care. ? ?4/20, PharmD ?Clinical Pharmacist ? ?Please check AMION for all [facility] Pharmacy numbers ?After 10:00 PM, call Main Pharmacy [phone] ? ? ? ?

## 2021-10-24 NOTE — Procedures (Signed)
Central Venous Catheter Insertion Procedure Note ? ?Jeffrey Goodwin  ?[id number]  ?Nov 11, 1965 ? ?Date:10/24/21  ?Time:2:48 PM  ? ?Provider Performing:Jayon Matton  Cyndie Chime  ? ?Procedure: Insertion of Non-tunneled Central Venous Catheter(36556) with US guidance (25053)  ? ?Indication(s) ?Medication administration ? ?Consent ?Unable to obtain consent due to emergent nature of procedure. ? ?Anesthesia ?Topical only with 1% lidocaine  ? ?Timeout ?Verified patient identification, verified procedure, site/side was marked, verified correct patient position, special equipment/implants available, medications/allergies/relevant history reviewed, required imaging and test results available. ? ?Sterile Technique ?Maximal sterile technique including full sterile barrier drape, hand hygiene, sterile gown, sterile gloves, mask, hair covering, sterile ultrasound probe cover (if used). ? ?Procedure Description ?Area of catheter insertion was cleaned with chlorhexidine and draped in sterile fashion.  With real-time ultrasound guidance a central venous catheter was placed into the left internal jugular vein. Nonpulsatile blood flow and easy flushing noted in all ports.  The catheter was sutured in place and sterile dressing applied. ? ?Complications/Tolerance ?None; patient tolerated the procedure well. ?Chest X-ray is ordered to verify placement for internal jugular or subclavian cannulation.   Chest x-ray is not ordered for femoral cannulation. ? ?EBL ?Minimal ? ?Specimen(s) ?None  ?

## 2021-10-24 NOTE — Progress Notes (Signed)
Inpatient Rehabilitation Admissions Coordinator  ? ?Noted intubation. I will follow from a distance. ? ?Danne Baxter, RN, MSN ?Rehab Admissions Coordinator ?[phone] ?10/24/2021 3:07 PM ? ?

## 2021-10-24 NOTE — Progress Notes (Signed)
Two nurse Chauncy Lean and Mauricia Area RN called and verify the patients wife and confirmed the wife's wishes to make her daughter Lamar, Meter primary responsible person in making decisions for her father.  ? ?

## 2021-10-24 NOTE — Consult Note (Signed)
? ?NAME:  Jeffrey Goodwin, MRN:  [id number], DOB:  12/29/1965, LOS: 6 ?ADMISSION DATE:  07-Nov-2021, CONSULTATION DATE:  10/24/2021 ?REFERRING MD:  Dr. Pola Corn, CHIEF COMPLAINT:   Respiratory failure  ? ?History of Present Illness:  ?Jeffrey Goodwin is a 56 y.o. male with a PMH significant for COPD, prior PE/DVT. HLD, HTN, PFO, prior limb ischemia resulting in need for embolectomy and fasciotomy (2022 at [facility]) and severe left hip osteoarthritis who presented initially 2021-11-07 for complaints of N/V, diarrhea, and subjective fevers. Also reported near syncope event PTA.  ? ?Patient was admitted per [facility] for management of severe sepsis in the setting of MRSA bacteremia due to unknown cause. Additionally patient developed severe thrombocytopenia and hematology was consulted  ? ?Morning of 3/27 patient was seen by [facility] who felt he was decompensating given decreased mentation and tachypnea with acute concern for evolving multifocal PNA, PCCM consulted  ? ?Pertinent  Medical History  ?COPD, prior PE/DVT. HLD, HTN, PFO, prior limb ischemia resulting in need for embolectomy and fasciotomy (2022 at [facility]) and severe left hip osteoarthritis ? ?Significant Hospital Events: ?Including procedures, antibiotic start and stop dates in addition to other pertinent events   ?3/21 admitted for complaints of N/V, diarrhea, and subjective fevers. Found severely septic in the setting of MRSA bacteremia, unknown cause . Cefepime and vanc started  ?3/22 ID consulted recommended to stop Cefepime, obtain left hip MRI and possible TEE ?3/23 platelet count dropped (felt secondary to sepsis), hematology consulted and heparin drip stopped  ?3/24 platelet count again dropped to 9, one unit of platelet transfuse ?3/27 PCCM consulted, transferred to ICU for further management  ? ?Interim History / Subjective:  ?Encephalopathic  ? ?Objective   ?Blood pressure (!) 109/39, pulse (!) 106, temperature 98.8 ?F (37.1 ?C), temperature source Oral,  resp. rate (!) 34, height 5\' 10"  (1.778 m), weight 113.4 kg, SpO2 96 %. ?   ?   ? ?Intake/Output Summary (Last 24 hours) at 10/24/2021 1112 ?Last data filed at 10/24/2021 0348 ?Gross per 24 hour  ?Intake 1190 ml  ?Output 2100 ml  ?Net -910 ml  ? ?Filed Weights  ? 07-Nov-2021 1700  ?Weight: 113.4 kg  ? ? ?Examination: ?General: Acute on chronically ill appearing elderly  male lying in bed, in NAD ?HEENT: [place]/AT, poor dental care, MM pink/moist, PERRL,  ?Neuro: Confused, unable to follow commands. ?CV: s1s2 regular rate and rhythm, no murmur, rubs, or gallops,  ?PULM:  Rhonchi bilaterally, mild accessory muscle use, on 3L [place]  ?GI: soft, bowel sounds active in all 4 quadrants, non-tender, non-distended ?Extremities: warm/dry, 2+ edema  ?Skin: no rashes or lesions ? ?Resolved Hospital Problem list   ? ? ?Assessment & Plan:  ?Acute Hypoxic Respiratory Failure  ?-Likely secondary to multifocal pneumonia, ? Aspiration  ?Hx of COPD ?P: ?Intubate on ICU arrival  ?Continue ventilator support with lung protective strategies  ?Wean PEEP and FiO2 for sats greater than 90%. ?Head of bed elevated 30 degrees. ?Plateau pressures less than 30 cm H20.  ?Follow intermittent chest x-ray and ABG.   ?SAT/SBT as tolerated, mentation preclude extubation  ?Ensure adequate pulmonary hygiene  ?Follow cultures  ?VAP bundle in place  ?PAD protocol ? ?Severe sepsis in the setting of MRSA bacteremia  ?-Unclear source, however patient has very poor dentition  ?P: ?Transfer to  ICU ?Intubated on ICU arrival for airway support  ?Follow repeat blood cultures  ?Continue IV vanc, Unasyn added 3/27 ?ID following, appreciate assistance  ?MAP goal <  65  ?Procalcitonin ?Monitor urine output ? ?Acute thrombocytopaenia  ?-Felt secondary to sever sepsis ?P: ?Hematology following, appreciate assistance ?Platelet count improved, hematology ok with resuming Heparin drip but await Head CT prior to resuming Heparin  ? ?Acute on encephalopathy  ?P: ?Repeat head  CT ?Management per neurology  ?Maintain neuro protective measures ?Nutrition and bowel regiment  ?Seizure precautions  ?Aspirations precautions  ?Minimize sedation  ? ?Renal. Hepatic, splenic infarcts ?-Repeat CTA ABD/Pelvis confirmed acute infarcts  ?-Unclear cause but possibly related to septic emboli vs thrombocytopenia  ?History of PE and DVT  ?History of acute limb ischemia s/p RLE embolectomy and fasciotomy 03/2021 ?P: ?Supportive care  ?Resume IV heparin when safe ? ?Acute Kidney Injury  ?-in the setting of sever sepsis  ?P: ?Follow renal function  ?Monitor urine output ?Trend Bmet ?Avoid nephrotoxins ?Ensure adequate renal perfusion  ? ?Hx of HLD/HTN ?P: ?Resume home meds when appropriate  ? ?Best Practice (right click and "Reselect all SmartList Selections" daily)  ? ?Diet/type: NPO ?DVT prophylaxis: systemic heparin ?GI prophylaxis: PPI ?Lines: N/A ?Foley:  N/A ?Code Status:  full code ?Last date of multidisciplinary goals of care discussion: See Plan of Care Note written 3/27 ? ?Labs   ?CBC: ?Recent Labs  ?Lab 10/20/2021 ?1700 10/19/21 ?0637 10/20/21 ?8937 10-31-21 ?3428 10/22/21 ?0303 10/23/21 ?0006 10/24/21 ?0401  ?WBC 18.8*   < > 19.7* 18.9* 18.5* 24.0* 28.3*  ?NEUTROABS 18.2*  --   --   --  15.2* 19.1* 22.2*  ?HGB 16.8   < > 14.4 14.0 13.2 12.8* 12.4*  ?HCT 51.1   < > 42.1 40.0 38.4* 36.3* 36.0*  ?MCV 87.1   < > 85.1 84.0 84.2 84.0 84.3  ?PLT 96*   < > 16* 9*  10* 16* 33* 65*  ? < > = values in this interval not displayed.  ? ? ?Basic Metabolic Panel: ?Recent Labs  ?Lab 10/19/21 ?0637 10/20/21 ?7681 10-31-21 ?1572 10/22/21 ?0303 10/23/21 ?0006 10/24/21 ?0401  ?NA 132* 134* 134* 135 139 144  ?K 3.3* 3.5 3.7 3.7 3.8 4.3  ?CL 98 103 101 102 106 109  ?CO2 23 23 25 25 27 26   ?GLUCOSE 107* 99 117* 107* 118* 116*  ?BUN 16 18 26* 29* 28* 42*  ?CREATININE 1.06 0.94 1.15 1.12 1.23 1.42*  ?CALCIUM 8.1* 7.9* 8.1* 8.1* 8.0* 8.2*  ?MG 1.6* 2.1  --   --   --   --   ?PHOS  --  1.6*  --  3.2  --   --    ? ?GFR: ?Estimated Creatinine Clearance: 72.4 mL/min (A) (by C-G formula based on SCr of 1.42 mg/dL (H)). ?Recent Labs  ?Lab 10/19/21 ?0637 10/20/21 ?10/22/21 10/20/21 ?1132 10/31/2021 ?10/23/21 10/22/21 ?0303 10/23/21 ?0006 10/23/21 ?1721 10/24/21 ?0401  ?PROCALCITON 15.68  --   --   --   --   --  9.02 9.58  ?WBC 19.0* 19.7*  --  18.9* 18.5* 24.0*  --  28.3*  ?LATICACIDVEN 2.3* 2.1* 2.1*  --   --  1.9  --   --   ? ? ?Liver Function Tests: ?Recent Labs  ?Lab 10/02/2021 ?1700 10/19/21 ?0637 10/20/21 ?10/22/21 10/23/21 ?0006 10/24/21 ?0401  ?AST 76* 169* 136* 106* 102*  ?ALT 42 109* 118* 69* 59*  ?ALKPHOS 61 62 87 97 96  ?BILITOT 2.0* 1.6* 1.9* 4.9* 5.0*  ?PROT 6.6 6.0* 5.3* 4.9* 5.4*  ?ALBUMIN 3.2* 2.9* 2.4* 1.8* 1.6*  ? ?No results for input(s): LIPASE, AMYLASE in the last  168 hours. ?No results for input(s): AMMONIA in the last 168 hours. ? ?ABG ?   ?Component Value Date/Time  ? PHART 7.47 (H) 10/22/2021 1854  ? PCO2ART 39 10/22/2021 1854  ? PO2ART 73 (L) 10/22/2021 1854  ? HCO3 28.4 (H) 10/22/2021 1854  ? O2SAT 96.4 10/22/2021 1854  ?  ? ?Coagulation Profile: ?Recent Labs  ?Lab 10/23/2021 ?1700 10/19/21 ?0637 09/29/2021 ?0312  ?INR 1.5* 1.2 1.1  ? ? ?Cardiac Enzymes: ?No results for input(s): CKTOTAL, CKMB, CKMBINDEX, TROPONINI in the last 168 hours. ? ?HbA1C: ?No results found for: HGBA1C ? ?CBG: ?Recent Labs  ?Lab 10/17/2021 ?1701 10/26/2021 ?1737 10/23/21 ?1327 10/23/21 ?1636  ?GLUCAP 122* 113* 111* 102*  ? ? ?Review of Systems:   ?Unable to assess  ? ?Past Medical History:  ?He,  has a past medical history of COPD (chronic obstructive pulmonary disease) (HCC), History of pulmonary embolus (PE), Hyperlipidemia, Hypertension, PFO (patent foramen ovale), and PVD (peripheral vascular disease) (HCC).  ? ?Surgical History:  ? ?Past Surgical History:  ?Procedure Laterality Date  ? FASCIOTOMY CLOSURE Right 04/25/2021  ? THROMBECTOMY ILIAC ARTERY Bilateral 04/23/2021  ? TOTAL HIP ARTHROPLASTY Right   ?  ? ?Social History:  ? reports that he  has quit smoking. His smoking use included cigarettes. He does not have any smokeless tobacco history on file. He reports that he does not currently use alcohol. He reports that he does not currently use drugs.  ? ?Family History:

## 2021-10-24 NOTE — Progress Notes (Signed)
Jeffrey Goodwin [id number] ?Admission Data: 10/24/2021 6:07 PM ?Attending Provider: Lorin Glass, MD  ?[id number], Provider Not In ?Consults/ Treatment Team: Treatment Team:  ?Rachel Moulds, MD ? ?Jeffrey Goodwin is a 56 y.o. male patient admitted from ED awake, alert  & orientated  X 3,  Full Code, VSS - Blood pressure (!) 143/45, pulse (!) 125, temperature 99.5 ?F (37.5 ?C), temperature source Axillary, resp. rate (!) 25, height 5\' 10"  (1.778 m), weight 113.4 kg, SpO2 98 %., O2 intubated vented,Tele # [id number] placed and pt is currently running:normal sinus rhythm. ? ? IV site WDL:  wrist right, condition patent and no redness and left, condition patent and no redness with a transparent dsg that's clean dry and intact. ? ?Allergies:  No Active Allergies ? ?  ?No evidence of skin break down noted on exam. ? ? ? ? ?Will cont to monitor and assist as needed. ? ? , RN ?10/24/2021 6:07 PM  ?

## 2021-10-24 NOTE — Progress Notes (Signed)
Brief hematology note: ? ?Labs from this morning have been reviewed.  Platelet count is improving.  No transfusion indicated at this time.  HIT panel negative. ? ?CBC ?   ?Component Value Date/Time  ? WBC 28.3 (H) 10/24/2021 0401  ? RBC 4.27 10/24/2021 0401  ? HGB 12.4 (L) 10/24/2021 0401  ? HCT 36.0 (L) 10/24/2021 0401  ? PLT 65 (L) 10/24/2021 0401  ? MCV 84.3 10/24/2021 0401  ? MCH 29.0 10/24/2021 0401  ? MCHC 34.4 10/24/2021 0401  ? RDW 18.2 (H) 10/24/2021 0401  ? LYMPHSABS 2.5 10/24/2021 0401  ? MONOABS 2.4 (H) 10/24/2021 0401  ? EOSABS 0.0 10/24/2021 0401  ? BASOSABS 0.1 10/24/2021 0401  ? ? ?Okay from our standpoint to resume anticoagulation with heparin or Lovenox and then transition back to Xarelto prior to discharge. ? ?We will continue to follow his chart peripherally.  Call as needed. ? ?Clenton Pare, DNP, AGPCNP-BC, AOCNP ? ? ?

## 2021-10-25 ENCOUNTER — Inpatient Hospital Stay (HOSPITAL_COMMUNITY): Payer: Medicare Other

## 2021-10-25 DIAGNOSIS — R6521 Severe sepsis with septic shock: Secondary | ICD-10-CM

## 2021-10-25 DIAGNOSIS — J9602 Acute respiratory failure with hypercapnia: Secondary | ICD-10-CM

## 2021-10-25 DIAGNOSIS — G9341 Metabolic encephalopathy: Secondary | ICD-10-CM

## 2021-10-25 LAB — BASIC METABOLIC PANEL
Anion gap: 10 (ref 5–15)
BUN: 62 mg/dL — ABNORMAL HIGH (ref 6–20)
CO2: 26 mmol/L (ref 22–32)
Calcium: 7.7 mg/dL — ABNORMAL LOW (ref 8.9–10.3)
Chloride: 108 mmol/L (ref 98–111)
Creatinine, Ser: 1.99 mg/dL — ABNORMAL HIGH (ref 0.61–1.24)
GFR, Estimated: 38 mL/min — ABNORMAL LOW (ref >60)
Glucose, Bld: 171 mg/dL — ABNORMAL HIGH (ref 70–99)
Potassium: 4.3 mmol/L (ref 3.5–5.1)
Sodium: 144 mmol/L (ref 135–145)

## 2021-10-25 LAB — CULTURE, BLOOD (ROUTINE X 2)
Culture: NO GROWTH
Special Requests: ADEQUATE

## 2021-10-25 LAB — VANCOMYCIN, TROUGH: Vancomycin Tr: 15 ug/mL (ref 15–20)

## 2021-10-25 LAB — BODY FLUID CELL COUNT WITH DIFFERENTIAL
Eos, Fluid: 0 %
Lymphs, Fluid: 16 %
Monocyte-Macrophage-Serous Fluid: 21 % — ABNORMAL LOW (ref 50–90)
Neutrophil Count, Fluid: 63 % — ABNORMAL HIGH (ref 0–25)
Total Nucleated Cell Count, Fluid: 113 cu mm (ref 0–1000)

## 2021-10-25 LAB — PROCALCITONIN: Procalcitonin: 15.95 ng/mL

## 2021-10-25 LAB — HEPATIC FUNCTION PANEL
ALT: 47 U/L — ABNORMAL HIGH (ref 0–44)
AST: 89 U/L — ABNORMAL HIGH (ref 15–41)
Albumin: 1.6 g/dL — ABNORMAL LOW (ref 3.5–5.0)
Alkaline Phosphatase: 84 U/L (ref 38–126)
Bilirubin, Direct: 2.6 mg/dL — ABNORMAL HIGH (ref 0.0–0.2)
Indirect Bilirubin: 1.7 mg/dL — ABNORMAL HIGH (ref 0.3–0.9)
Total Bilirubin: 4.3 mg/dL — ABNORMAL HIGH (ref 0.3–1.2)
Total Protein: 5.7 g/dL — ABNORMAL LOW (ref 6.5–8.1)

## 2021-10-25 LAB — POCT I-STAT 7, (LYTES, BLD GAS, ICA,H+H)
Acid-Base Excess: 4 mmol/L — ABNORMAL HIGH (ref 0.0–2.0)
Bicarbonate: 28.1 mmol/L — ABNORMAL HIGH (ref 20.0–28.0)
Calcium, Ion: 1.1 mmol/L — ABNORMAL LOW (ref 1.15–1.40)
HCT: 34 % — ABNORMAL LOW (ref 39.0–52.0)
Hemoglobin: 11.6 g/dL — ABNORMAL LOW (ref 13.0–17.0)
O2 Saturation: 99 %
Patient temperature: 39.6
Potassium: 4.2 mmol/L (ref 3.5–5.1)
Sodium: 144 mmol/L (ref 135–145)
TCO2: 29 mmol/L (ref 22–32)
pCO2 arterial: 44.5 mmHg (ref 32–48)
pH, Arterial: 7.419 (ref 7.35–7.45)
pO2, Arterial: 144 mmHg — ABNORMAL HIGH (ref 83–108)

## 2021-10-25 LAB — GLUCOSE, CAPILLARY
Glucose-Capillary: 133 mg/dL — ABNORMAL HIGH (ref 70–99)
Glucose-Capillary: 159 mg/dL — ABNORMAL HIGH (ref 70–99)
Glucose-Capillary: 121 mg/dL — ABNORMAL HIGH (ref 70–99)
Glucose-Capillary: 159 mg/dL — ABNORMAL HIGH (ref 70–99)

## 2021-10-25 LAB — CBC
HCT: 34.6 % — ABNORMAL LOW (ref 39.0–52.0)
Hemoglobin: 12 g/dL — ABNORMAL LOW (ref 13.0–17.0)
MCH: 29.7 pg (ref 26.0–34.0)
MCHC: 34.7 g/dL (ref 30.0–36.0)
MCV: 85.6 fL (ref 80.0–100.0)
Platelets: 90 10*3/uL — ABNORMAL LOW (ref 150–400)
RBC: 4.04 MIL/uL — ABNORMAL LOW (ref 4.22–5.81)
RDW: 18.9 % — ABNORMAL HIGH (ref 11.5–15.5)
WBC: 28.9 10*3/uL — ABNORMAL HIGH (ref 4.0–10.5)
nRBC: 0.3 % — ABNORMAL HIGH (ref 0.0–0.2)

## 2021-10-25 LAB — TRIGLYCERIDES
Triglycerides: 450 mg/dL — ABNORMAL HIGH (ref <150)
Triglycerides: 461 mg/dL — ABNORMAL HIGH (ref <150)

## 2021-10-25 LAB — HEPARIN LEVEL (UNFRACTIONATED): Heparin Unfractionated: <0.1 IU/mL — ABNORMAL LOW (ref 0.30–0.70)

## 2021-10-25 LAB — MAGNESIUM: Magnesium: 3.3 mg/dL — ABNORMAL HIGH (ref 1.7–2.4)

## 2021-10-25 LAB — LACTIC ACID, PLASMA: Lactic Acid, Venous: 1.8 mmol/L (ref 0.5–1.9)

## 2021-10-25 LAB — AMMONIA: Ammonia: 43 umol/L — ABNORMAL HIGH (ref 9–35)

## 2021-10-25 SURGERY — ECHOCARDIOGRAM, TRANSESOPHAGEAL
Anesthesia: Monitor Anesthesia Care

## 2021-10-25 MED ORDER — GLYCOPYRROLATE 0.2 MG/ML IJ SOLN: 0.2000 mg | INTRAMUSCULAR | Status: DC | PRN

## 2021-10-25 MED ORDER — VANCOMYCIN HCL IN DEXTROSE 1-5 GM/200ML-% IV SOLN
1000.0000 mg | Freq: Two times a day (BID) | INTRAVENOUS | Status: DC
  Filled 2021-10-25 (×2): qty 200

## 2021-10-25 MED ORDER — GLYCOPYRROLATE 1 MG PO TABS: 1.0000 mg | ORAL_TABLET | ORAL | Status: DC | PRN

## 2021-10-25 MED ORDER — FENTANYL CITRATE (PF) 100 MCG/2ML IJ SOLN: 25.0000 ug | INTRAMUSCULAR | Status: DC | PRN

## 2021-10-25 MED ORDER — FAMOTIDINE 20 MG PO TABS: 20.0000 mg | ORAL_TABLET | Freq: Two times a day (BID) | ORAL | Status: DC

## 2021-10-25 MED ORDER — SODIUM CHLORIDE 0.9% FLUSH
10.0000 mL | Freq: Two times a day (BID) | INTRAVENOUS | Status: DC
  Administered 2021-10-25: 10 mL

## 2021-10-25 MED ORDER — MORPHINE SULFATE (PF) 2 MG/ML IV SOLN: 2.0000 mg | INTRAVENOUS | Status: DC | PRN

## 2021-10-25 MED ORDER — MIDAZOLAM HCL 2 MG/2ML IJ SOLN: 1.0000 mg | INTRAMUSCULAR | Status: DC | PRN

## 2021-10-25 MED ORDER — MIDAZOLAM HCL 2 MG/2ML IJ SOLN: 2.0000 mg | INTRAMUSCULAR | Status: DC | PRN

## 2021-10-25 MED ORDER — MORPHINE BOLUS VIA INFUSION
5.0000 mg | INTRAVENOUS | Status: DC | PRN
  Filled 2021-10-25: qty 5

## 2021-10-25 MED ORDER — DIPHENHYDRAMINE HCL 50 MG/ML IJ SOLN: 25.0000 mg | INTRAMUSCULAR | Status: DC | PRN

## 2021-10-25 MED ORDER — MORPHINE 100MG IN NS 100ML (1MG/ML) PREMIX INFUSION
0.0000 mg/h | INTRAVENOUS | Status: DC
  Administered 2021-10-25 (×2): 10 mg/h via INTRAVENOUS
  Filled 2021-10-25 (×2): qty 100

## 2021-10-25 MED ORDER — ACETAMINOPHEN 650 MG RE SUPP
650.0000 mg | Freq: Four times a day (QID) | RECTAL | Status: DC | PRN
Start: 2021-10-25 — End: 2021-10-26

## 2021-10-25 MED ORDER — ACETAMINOPHEN 325 MG PO TABS: 650.0000 mg | ORAL_TABLET | Freq: Four times a day (QID) | ORAL | Status: DC | PRN

## 2021-10-25 MED ORDER — POLYVINYL ALCOHOL 1.4 % OP SOLN: 1.0000 [drp] | Freq: Four times a day (QID) | OPHTHALMIC | Status: DC | PRN

## 2021-10-26 LAB — CULTURE, BLOOD (ROUTINE X 2)
Special Requests: ADEQUATE
Special Requests: ADEQUATE

## 2021-10-27 LAB — CULTURE, RESPIRATORY W GRAM STAIN

## 2021-10-29 LAB — CULTURE, BLOOD (ROUTINE X 2)
Culture: NO GROWTH
Culture: NO GROWTH
Special Requests: ADEQUATE
Special Requests: ADEQUATE

## 2021-10-29 NOTE — Progress Notes (Signed)
?   ? ?[facility] ? ?Date of Admission:  10/22/2021    ?       ?Reason for visit: Follow up on MRSA bacteremia ? ?Current antibiotics: ?Vancomycin 3/21-present ?Unasyn 3/27-present ?  ? ?ASSESSMENT:   ? ?56 y.o. male admitted with: ? 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  TEE is pending and repeat blood cultures drawn yesterday are no growth to date.  MRI brain also pending.  ?Septic shock: Secondary to #1. ?Multifocal pneumonia and hypoxic respiratory failure: Noted on repeat CT scan 3/26 and requiring mechanical ventilation.  Suspected component of aspiration +/- septic emboli. ?Encephalopathy: MRI brain pending. ?Elevated LFTs: Stable/improved. ?Thrombocytopenia: Platelets improved to 90 today. ?Acute kidney injury: Worsening creatinine today. ?History of DVT/PE and acute limb ischemia: Status post right lower extremity embolectomy and fasciotomy September 2022 ? ?RECOMMENDATIONS:   ? ?Continue vancomycin pending MRI brain to evaluate for CNS involvement.  If MRI brain negative, can likely switch to Daptomycin and Doxycycline for continued MRSA lung coverage ?Follow up repeat blood cultures ?Await TEE and MRI brain ?Will need lines exchanged when feasible (3/27 IJ placed while bacteremic) ?Continue Unasyn for aspiration pneumonia ?Will follow ? ? ?Principal Problem: ?  MRSA bacteremia ?Active Problems: ?  Septic shock ([place]) ?  COPD (chronic obstructive pulmonary disease) ([place]) ?  History of pulmonary embolus (PE) ?  Hyperlipidemia ?  Hypertension ?  PFO (patent foramen ovale) ?  PVD (peripheral vascular disease) ([place]) ?  Abnormal CT scan ?  Enlarged thyroid ?  Near syncope ?  Thrombocytopenia ([place]) ?  AKI (acute kidney injury) ([place]) ?  Osteoarthritis of left hip ?  Sepsis due to methicillin resistant Staphylococcus aureus (MRSA) ([place]) ?  Left hip pain ?  Acute metabolic  encephalopathy ?  Acute respiratory failure with hypercapnia (HCC) ? ? ? ?MEDICATIONS:   ? ?Scheduled Meds: ? arformoterol  15 mcg Nebulization BID  ? atorvastatin  10 mg Per Tube QHS  ? budesonide (PULMICORT) nebulizer solution  0.5 mg Nebulization BID  ? chlorhexidine gluconate (MEDLINE KIT)  15 mL Mouth Rinse BID  ? Chlorhexidine Gluconate Cloth  6 each Topical Daily  ? docusate  100 mg Per Tube BID  ? gabapentin  600 mg Per Tube TID  ? mouth rinse  15 mL Mouth Rinse 10 times per day  ? polyethylene glycol  17 g Per Tube Daily  ? revefenacin  175 mcg Nebulization Daily  ? senna-docusate  1 tablet Per Tube QHS  ? sodium chloride flush  10-40 mL Intracatheter Q12H  ? sodium chloride flush  3 mL Intravenous Q12H  ? sodium chloride flush  3 mL Intravenous Q12H  ? tamsulosin  0.4 mg Oral Daily  ? ?Continuous Infusions: ? sodium chloride Stopped (10/22/21 0356)  ? ampicillin-sulbactam (UNASYN) IV Stopped (November 12, 2021 0548)  ? famotidine (PEPCID) IV Stopped (10/24/21 2151)  ? norepinephrine (LEVOPHED) Adult infusion 20 mcg/min (11-12-2021 0600)  ? propofol (DIPRIVAN) infusion 15 mcg/kg/min (11/12/2021 0600)  ? vancomycin    ? vasopressin    ? ?PRN Meds:.sodium chloride, acetaminophen **OR** acetaminophen, albuterol, fentaNYL (SUBLIMAZE) injection, fentaNYL (SUBLIMAZE) injection, ondansetron **OR** ondansetron (ZOFRAN) IV, oxyCODONE, sodium chloride flush ? ?SUBJECTIVE:  ? ?24 hour events:  ?Patient was transferred to the ICU yesterday and subsequently intubated.  Central line also placed in the left IJ ?Febrile, Tmax 103.4 ?Remains intubated.  FiO2 40%, PEEP 5. ?Requiring norepinephrine  and propofol ?WBC 28.9, platelets 90, hemoglobin 12 ?Creatinine rising up to 1.99 ?LFTs slightly improved ?MRI brain pending ?Repeat blood cultures drawn yesterday no growth to date ?BAL cultures negative to date ?RN reports minimal secretions and tolerating current vent settings ? ? ?Review of Systems  ?Unable to perform ROS: Intubated  ? ?   ?OBJECTIVE:  ? ?Blood pressure (!) 121/51, pulse (!) 110, temperature (!) 103.4 ?F (39.7 ?C), temperature source Axillary, resp. rate (!) 31, height 5' 10" (1.778 m), weight 114.2 kg, SpO2 100 %. ?Body mass index is 36.12 kg/m?. ? ?Physical Exam ?Constitutional:   ?   Comments: Intubated, ill appearing, sedated on vent  ?HENT:  ?   Head: Normocephalic and atraumatic.  ?Eyes:  ?   Conjunctiva/sclera: Conjunctivae normal.  ?Neck:  ?   Comments: Left IJ in place (3/27) ?ET tube in place ?Cardiovascular:  ?   Rate and Rhythm: Normal rate and regular rhythm.  ?Pulmonary:  ?   Comments: Symmetric chest rise and fall on vent ?Abdominal:  ?   General: There is no distension.  ?   Palpations: Abdomen is soft.  ?   Tenderness: There is no abdominal tenderness.  ?Musculoskeletal:     ?   General: Swelling present.  ?Skin: ?   General: Skin is warm and dry.  ?   Findings: No rash.  ?Neurological:  ?   Comments: Sedated.   ?Psychiatric:  ?   Comments: Sedated.   ? ? ? ?Lab Results: ?Lab Results  ?Component Value Date  ? WBC 28.9 (H) 11-04-2021  ? HGB 12.0 (L) 11/04/21  ? HCT 34.6 (L) 2021-11-04  ? MCV 85.6 2021/11/04  ? PLT 90 (L) 11/04/2021  ?  ?Lab Results  ?Component Value Date  ? NA 144 11-04-2021  ? K 4.3 November 04, 2021  ? CO2 26 04-Nov-2021  ? GLUCOSE 171 (H) 11/04/21  ? BUN 62 (H) 2021-11-04  ? CREATININE 1.99 (H) 2021-11-04  ? CALCIUM 7.7 (L) 04-Nov-2021  ? GFRNONAA 38 (L) 2021-11-04  ?  ?Lab Results  ?Component Value Date  ? ALT 47 (H) 11/04/2021  ? AST 89 (H) 04-Nov-2021  ? ALKPHOS 84 Nov 04, 2021  ? BILITOT 4.3 (H) 11-04-21  ? ? ?No results found for: CRP ? ?No results found for: ESRSEDRATE ?  ?I have reviewed the micro and lab results in Epic. ? ?Imaging: ?DG Chest Port 1 View ? ?Result Date: Nov 04, 2021 ?CLINICAL DATA:  History of endotracheal tube. EXAM: PORTABLE CHEST 1 VIEW COMPARISON:  October 24, 2021. FINDINGS: Endotracheal tube now proximally 3.3 cm from the carina of, previously 2.0 cm. Gastric tube tip in the  proximal stomach, side port likely above the GE junction. LEFT-sided IJ central venous catheter tip in the proximal superior vena cava retracted slightly, approximately 1 cm potentially since the prior study. EKG leads project over the patient's chest. Cardiomediastinal contours and hilar structures are normal. No lobar consolidation. Mild interstitial prominence in the setting of low lung volumes with subtle basilar opacities on the LEFT likely atelectasis. On limited assessment there is no acute skeletal finding. IMPRESSION: 1. Endotracheal tube now 3.3 cm from the carina, previously 2.0 cm. 2. Gastric tube tip in the proximal stomach, side port likely above the GE junction. 3. LEFT IJ central venous catheter tip in the proximal superior vena cava. Perhaps slightly retracted since prior imaging. Electronically Signed   By: Zetta Bills M.D.   On: 11/04/21 08:05  ? ?DG CHEST PORT 1 VIEW ? ?Result Date:  10/24/2021 ?CLINICAL DATA:  Support devices EXAM: PORTABLE CHEST 1 VIEW COMPARISON:  Chest x-ray dated October 18, 2021 FINDINGS: Interval placement of ET tube which is approximately 2.0 cm from the carina. Interval placement of left IJ line with tip positioned over the expected area of the lower SVC. Enteric tube with tip in the stomach and side port in the distal esophagus. Mild bibasilar opacities, likely due to atelectasis. No large pleural effusion or pneumothorax. IMPRESSION: 1. Enteric tube with tip in the stomach and side port in the distal esophagus, recommend advancement for optimal positioning. 2. Interval placement of ET tube which is approximately 2.0 cm from the carina. 3. Interval placement of left IJ line with tip positioned over the expected area of the lower SVC. Electronically Signed   By: Yetta Glassman M.D.   On: 10/24/2021 16:52  ? ?CT Angio Abd/Pel w/ and/or w/o ? ?Result Date: 10/23/2021 ?CLINICAL DATA:  Renal ischemia or infarction. Diffuse abdominal tenderness. MRSA bacteremia. Prior scans  demonstrate renal and splenic infarcts. EXAM: CTA ABDOMEN AND PELVIS WITHOUT AND WITH CONTRAST TECHNIQUE: Multidetector CT imaging of the abdomen and pelvis was performed using the standard protocol during bo

## 2021-10-29 NOTE — Progress Notes (Signed)
? ? ?  [facility] has been requested to perform a transesophageal echocardiogram on Jeffrey Goodwin for infective endocarditis.  After careful review of history and examination, the risks and benefits of transesophageal echocardiogram have been explained including risks of esophageal damage, perforation (1:10,000 risk), bleeding, pharyngeal hematoma as well as other potential complications associated with conscious sedation including aspiration, arrhythmia, respiratory failure and death. Alternatives to treatment were discussed, questions were answered. Patient's daughter is willing to proceed.   Patient was previously consented when plan was for non ICU evaluation in endoscopy suite.  Reviewed with ICU team- will proceed to TEE. ? ?Werner Lean, MD  ?13-Nov-2021 12:10 PM  ? ?

## 2021-10-29 NOTE — Plan of Care (Signed)
?  Interdisciplinary Goals of Care Family Meeting ? ? ?Date carried out:: 10/01/2021 ? ?Location of the meeting: Bedside ? ?Member's involved: Physician, Bedside Registered Nurse, and Family Member or next of kin ? ?Durable Power of Attorney or acting medical decision maker: Jeffrey Goodwin Jeffrey Goodwin   ? ?Discussion: We discussed goals of care for Jeffrey Goodwin .   ? ?GOALS OF CARE DISCUSSION ?  ?The Clinical status was relayed to daughter at bedside  in detail. ?  ?Updated and notified of patients medical condition. ?  ?  ?Patient remains unresponsive and will not open eyes to command.   ?Patient is having a weak cough and struggling to remove secretions.   ?Patient with increased WOB and using accessory muscles to breathe ?Explained to family course of therapy and the modalities  ?Evidence of kidney failure ?  ?Patient with Progressive multiorgan failure with a very high probablity of a very minimal chance of meaningful recovery despite all aggressive and optimal medical therapy. ? ? ?Code status: Full DNR ? ?Disposition: Continue current acute care ? ?  ?Family are satisfied with Plan of action and management. All questions answered ? ?Jeffrey Fowler MD ?[facility] ?See Amion for pager ?If no response to pager, please call [phone] until 7pm ?After 7pm, Please call E-link [phone] ? ?

## 2021-10-29 NOTE — Death Summary Note (Signed)
?DEATH SUMMARY  ? ?Patient Details  ?Name: Jeffrey Goodwin ?MRN: [id number] ?DOB: [birth year] ? ?Admission/Discharge Information  ? ?Admit Date:  Nov 08, 2021  ?Date of Death: Date of Death: 11/15/2021  ?Time of Death: Time of Death: 48  ?Length of Stay: 7  ?Referring Physician: System, Provider Not In  ? 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 ?Diagnoses  ?Preliminary cause of death:   Withdrawal of care due to refractory septic shock caused by MRSA bacteremia and disseminated infection ? ?Secondary Diagnoses (including complications and co-morbidities):  ?Principal Problem: ?  MRSA bacteremia ?Active Problems: ?  Septic shock (HCC) ?  COPD (chronic obstructive pulmonary disease) (HCC) ?  History of pulmonary embolus (PE) ?  Hyperlipidemia ?  Hypertension ?  PFO (patent foramen ovale) ?  PVD (peripheral vascular disease) (HCC) ?  Abnormal CT scan ?  Enlarged thyroid ?  Near syncope ?  Thrombocytopenia (HCC) ?  AKI (acute kidney injury) (HCC) ?  Osteoarthritis of left hip ?  Sepsis due to methicillin resistant Staphylococcus aureus (MRSA) (HCC) ?  Left hip pain ?  Acute metabolic encephalopathy ?  Acute respiratory failure with hypercapnia (HCC) ? ? ?Brief Hospital Course (including significant findings, care, treatment, and services provided and events leading to death)  ?Jeffrey Goodwin is a 56 y.o. year old male with a PMH significant for COPD, prior PE/DVT. HLD, HTN, PFO, prior limb ischemia resulting in need for embolectomy and fasciotomy (2022 at [facility]) and severe left hip osteoarthritis who presented initially 11-08-2021 for complaints of N/V, diarrhea, and  subjective fevers. Also reported near syncope event PTA.  ?  ?Patient was admitted per [facility] for management of severe sepsis in the setting of MRSA bacteremia due to unknown cause. Additionally patient developed severe thrombocytopenia and hematology was consulted  ?  ?Morning of 3/27 patient was seen by [facility] who felt he was decompensating given decreased mentation and tachypnea with acute concern for evolving multifocal PNA, PCCM consulted  ? ?Patient was transferred to ICU, he went into septic shock, started on IV vasopressors, blood cultures grew MRSA, infectious disease was consulted, he was on appropriate antibiotic therapy but he remained in refractory vasoplegic shock caused by severe sepsis, despite antibiotic therapy he did not improve, he started complaining of shortness of breath, became hypoxic he was intubated and placed on mechanical ventilator.  Patient had MRI brain which confirmed multiple embolic strokes, he was a scheduled for TEE, goals of care discussion were carried with patient's daughter considering patient was in refractory septic shock with multiorgan failure, patient's daughter decided to keep him DNR/DNI and proceed with comfort care and palliative extubation.  Patient was extubated and he was made comfortable and he died on 11/15/21, family was at bedside ? ? ?Pertinent Labs and Studies  ?Significant Diagnostic Studies ?CT Head Wo Contrast ? ?Result Date: 2021-11-08 ?CLINICAL DATA:  Head trauma, moderate-severe; Neck trauma, dangerous injury mechanism (Age 58-64y) EXAM: CT HEAD WITHOUT CONTRAST CT CERVICAL SPINE WITHOUT CONTRAST TECHNIQUE: Multidetector CT imaging of the head and cervical spine was performed following the standard protocol without intravenous contrast. Multiplanar CT image reconstructions of the cervical spine were also generated. RADIATION DOSE REDUCTION: This exam was performed according to the departmental dose-optimization program which includes automated exposure  control, adjustment of the mA and/or kV according to patient size and/or use of iterative reconstruction technique. COMPARISON:  None. FINDINGS: CT HEAD FINDINGS Brain: No evidence of acute infarction, hemorrhage, hydrocephalus, extra-axial collection or mass lesion/mass effect. Mild patchy white matter hypodensities, nonspecific but compatible with chronic microvascular ischemic disease. Vascular: No hyperdense vessel identified. Mild calcific intracranial atherosclerosis. Skull: No acute fracture. Sinuses/Orbits: Visualized sinuses are clear. No acute orbital findings. Other: No mastoid effusions. CT CERVICAL SPINE FINDINGS Motion limited study. Alignment: Straightening of the normal cervical lordosis. Slight anterolisthesis of C4 on C5, favor degenerative in etiology given facet arthropathy at this level. Otherwise, no substantial sagittal subluxation. Skull base and vertebrae: Motion limited assessment without evidence of acute fracture Soft tissues and spinal canal: No prevertebral fluid or swelling. No visible canal hematoma. Disc levels: Multilevel degenerative disc disease with disc height loss and posterior endplate spurring. Multilevel facet and uncovertebral hypertrophy with varying degrees of neural foraminal stenosis. Upper chest: Visualized lung apices are clear. Other: Enlarged and heterogeneous thyroid. IMPRESSION: CT head: No evidence of acute intracranial abnormality. CT cervical spine: 1. No evidence of acute fracture or traumatic malalignment on this motion limited study. 2. Multilevel degenerative change, as detailed above. 3. Enlarged and heterogeneous thyroid. Recommend thyroid ultrasound (ref: J Am Coll Radiol. 2015 Feb;12(2): 143-50). Electronically Signed   By: Feliberto Harts M.D.   On: 10/04/2021 17:26  ? ?CT Cervical Spine Wo Contrast ? ?Result Date: 10/16/2021 ?CLINICAL DATA:  Head trauma, moderate-severe; Neck trauma, dangerous injury mechanism (Age 67-64y) EXAM: CT HEAD WITHOUT  CONTRAST CT CERVICAL SPINE WITHOUT CONTRAST TECHNIQUE: Multidetector CT imaging of the head and cervical spine was performed following the standard protocol without intravenous contrast. Multiplanar CT image reconstructions of the cervical spine were also generated. RADIATION DOSE REDUCTION: This exam was performed according to the departmental dose-optimization program which includes automated exposure control, adjustment of the mA and/or kV according to patient size and/or use of iterative reconstruction technique. COMPARISON:  None. FINDINGS: CT HEAD FINDINGS Brain: No evidence of acute infarction, hemorrhage, hydrocephalus, extra-axial collection or mass lesion/mass effect. Mild patchy white matter hypodensities, nonspecific but compatible with chronic microvascular ischemic disease. Vascular: No hyperdense vessel identified. Mild calcific intracranial atherosclerosis. Skull: No acute fracture. Sinuses/Orbits: Visualized sinuses are clear. No acute orbital findings. Other: No mastoid effusions. CT CERVICAL SPINE FINDINGS Motion limited study. Alignment: Straightening of the normal cervical lordosis. Slight anterolisthesis of C4 on C5, favor degenerative in etiology given facet arthropathy at this level. Otherwise, no substantial sagittal subluxation. Skull base and vertebrae: Motion limited assessment without evidence of acute fracture Soft tissues and spinal canal: No prevertebral fluid or swelling. No visible canal hematoma. Disc levels: Multilevel degenerative disc disease with disc height loss and posterior endplate spurring. Multilevel facet and uncovertebral hypertrophy with varying degrees of neural foraminal stenosis. Upper chest: Visualized lung apices are clear. Other: Enlarged and heterogeneous thyroid. IMPRESSION: CT head: No evidence of acute intracranial abnormality. CT cervical spine: 1. No evidence of acute fracture or traumatic malalignment on this motion limited study. 2. Multilevel  degenerative change, as detailed above. 3. Enlarged and heterogeneous thyroid. Recommend thyroid ultrasound (ref: J Am Coll Radiol. 2015 Feb;12(2): 143-50). Electronically Signed   By: Feliberto Harts M.D.   On: 03/21/202

## 2021-10-29 NOTE — Progress Notes (Signed)
Patient was compassionately extubated at 1517 per MD order/family wishes. Family was present at bedside. ?

## 2021-10-29 NOTE — Plan of Care (Signed)
?  Interdisciplinary Goals of Care Family Meeting ? ? ?Date carried out:: 10/23/2021 ? ?Location of the meeting: Conference room ? ?Member's involved: Nurse Practitioner, Bedside Registered Nurse, and Family Member or next of kin ? ?Durable Power of Insurance risk surveyor: Daughter: Jeffrey Goodwin, Wife: Jeffrey Goodwin (Via phone) ? ?Discussion: We discussed goals of care for Jeffrey Goodwin. ? ?Discussed with Jos?, bedside RN, daughter and wife. ? ?Sonia Side stated that she had discussed the patient's wishes with wife [name].  Her father was continuing to worsen and she did not want him to suffer further.  She wished for him to be made comfortable. MRI was pending. MRI concerning for frontal, parietal, and occipital lobe infarcts with areas of associated hemorrhage.  Likely secondary to septic embolic process. ? ?Discussed MRI results with wife and daughter.  Patient with progressive multiorgan failure.  Minimal chance of meaningful recovery despite aggressive medical therapies.  Plan of care.  Family would still like to make patient comfortable for natural death. ? ?Patient made DNR.  Comfort care order set placed.  Family at bedside. ? ?Code status: Full DNR ? ?Disposition: In-patient comfort care ? ? ?Time spent for the meeting: 10 minutes ? ?Eliezer Champagne ?10/11/2021, 12:21 PM ? ?

## 2021-10-29 NOTE — Progress Notes (Addendum)
? ?NAME:  Jeffrey Goodwin, MRN:  [id number], DOB:  October 02, 1965, LOS: 7 ?ADMISSION DATE:  10/04/2021, CONSULTATION DATE:  10/24/2021 ?REFERRING MD:  Dr. Pietro Cassis, CHIEF COMPLAINT:   Respiratory failure  ? ?History of Present Illness:  ?Jeffrey Goodwin is a 56 y.o. male with a PMH significant for COPD, prior PE/DVT. HLD, HTN, PFO, prior limb ischemia resulting in need for embolectomy and fasciotomy (2022 at [facility]) and severe left hip osteoarthritis who presented initially 10/10/2021 for complaints of N/V, diarrhea, and subjective fevers. Also reported near syncope event PTA.  ? ?Patient was admitted per [facility] for management of severe sepsis in the setting of MRSA bacteremia due to unknown cause. Additionally patient developed severe thrombocytopenia and hematology was consulted  ? ?Morning of 3/27 patient was seen by [facility] who felt he was decompensating given decreased mentation and tachypnea with acute concern for evolving multifocal PNA, PCCM consulted  ? ?Pertinent  Medical History  ?COPD, prior PE/DVT. HLD, HTN, PFO, prior limb ischemia resulting in need for embolectomy and fasciotomy (2022 at [facility]) and severe left hip osteoarthritis ? ?Significant Hospital Events: ?Including procedures, antibiotic start and stop dates in addition to other pertinent events   ?3/21 admitted for complaints of N/V, diarrhea, and subjective fevers. Found severely septic in the setting of MRSA bacteremia, unknown cause . Cefepime and vanc started  ?3/22 ID consulted recommended to stop Cefepime, obtain left hip MRI and possible TEE ?3/23 platelet count dropped (felt secondary to sepsis), hematology consulted and heparin drip stopped  ?3/24 platelet count again dropped to 9, one unit of platelet transfuse ?3/26 BC> ?3/27 PCCM consulted, transferred to ICU for further management, intubated, CVC, Aline, Unasyn>, Bronch, BAL> ? ? ?Interim History / Subjective:  ?Tmax 103 ?+1.3L past 24 hours, 800 uop, +6.2L admit ? ?Unable to obtain  subjective evaluation due to patient status ? ? ?Objective   ?Blood pressure (!) 121/51, pulse (!) 110, temperature (!) 101.8 ?F (38.8 ?C), temperature source Axillary, resp. rate (!) 31, height _0  (1.778 m), weight 114.2 kg, SpO2 98 %. ?   ?Vent Mode: PRVC ?FiO2 (%):  [40 %-100 %] 40 % ?Set Rate:  [18 bmp-22 bmp] 22 bmp ?Vt Set:  [580 mL] 580 mL ?PEEP:  [10 cmH20] 10 cmH20 ?Plateau Pressure:  [18 cmH20-22 cmH20] 18 cmH20  ? ?Intake/Output Summary (Last 24 hours) at 2021-11-16 9417 ?Last data filed at November 16, 2021 0600 ?Gross per 24 hour  ?Intake 2171.3 ml  ?Output 800 ml  ?Net 1371.3 ml  ? ? ?Filed Weights  ? 10/07/2021 1700 16-Nov-2021 0500  ?Weight: 113.4 kg 114.2 kg  ? ? ?Examination: ?General: In bed, NAD, appears comfortable ?HEENT: MM pink/moist, anicteric, atraumatic ?Neuro: sedated, PERRL 94m ?CV: S1S2, ST, no m/r/g appreciated ?PULM:  air movement in all lobes, trachea midline, chest expansion symmetric ?GI: soft, bsx4 active, non-tender   ?Extremities: warm/dry, +1 pretibial edema, capillary refill less than 3 seconds  ?Skin:  no rashes or lesions noted ? ?Labs ?BAL: pending ?BC 3/26> GPC in clusters, 3/23> staph aureus  ?Creat 1.42>1.99, BUN 42>62 ?Glucose 171 ?AST 102>89, ALT 59>47 Bili 5.0>4.3 ?PCT 9.58>15.95 ?WBC 28.3>28.9 ?HGB 12.9>12.0 ?PLT 65>90 ?Ammonia 43 ?MG 3.3 ?TG 450 ?ABG 7.33/62/286/34 ?CXR: Stable lines and tubes, no pneumo, no significant effusion, bibasilar opacities  ?[place]ABD: exam limites, possible hepatic steatosis, nonvisualization of gallbladder ? ?Resolved Hospital Problem list   ? ? ?Assessment & Plan:  ?Acute Hypoxic Respiratory Failure  ?Suspect secondary to multifocal pneumonia, ? Aspiration  ?Hx  of COPD ?P: ?-LTVV strategy with tidal volumes of 4-8 cc/kg ideal body weight ?-Goal plateau pressures less than 30 and driving pressures less than 15 ?-Wean PEEP/FiO2 for SpO2 92-98% ?-VAP bundle ?-Daily SAT and SBT ?-PAD bundle with Propofol gtt and fentanyl push, TG elevated, may switch to  precedex today based on repeat draw ?-RASS goal 0 to -1 ?-Follow intermittent CXR and ABG PRN ?-Continue brovana and pulmicort ?-Follow up BALs ? ?Septic shock in the setting of MRSA bacteremia  ?Unclear source, patient has very poor dentition. On 19 of levophed, +6L this admission  ?P: ?-ID following, appreciate assistance. Vanc per ID. ?-Follow up blood cultures. ?-Goal MAP greater than 65. Titrate levophed to goal. Add vasopressin. ?-Strict I&O ?-Continue unasyn  ?-TEE pending ? ?Acute thrombocytopaenia  ?Felt secondary to sever sepsis per heme, PLT 65>90 ?P: ?-Hematology following, appreciate assistance ?-OK to start Hep GTT per heme, does not feel related to HIT ?-Follow plt count ? ?Acute on encephalopathy  ?Known sepsis, MRSA bacteremia, ammonia slightly elevated, currently sedated ?P: ?-Follow up brain MRI ?-Continue neuroprotective measures- normothermia, euglycemia, HOB greater than 30, head in neutral alignment, normocapnia, normoxia.  ?-Start lactulose ?-seizure precutions ? ?Renal. Hepatic, splenic infarcts ?Repeat CTA ABD/Pelvis confirmed acute infarcts. Unclear cause but possibly related to septic emboli vs thrombocytopenia. LFT downtrending  ?History of PE and DVT  ?History of acute limb ischemia s/p RLE embolectomy and fasciotomy 03/2021 ?P: ?-Start heparin GTT s/p MRI. ?-Supportive care ? ?Acute Kidney Injury  ?in the setting of severe sepsis with shock and septic ATN, Creat 1.42>1.99, BUN 42>62.  ?P: ?-Ensure renal perfusion. Goal MAP 65 or greater. ?-Avoid neprotoxic drugs as possible. ?-Strict I&O's ?-Follow up AM creatinine ? ?Hx of HLD/HTN ?Currently hypotensive on levophed ?P: ?-Holding home antihypertensives at this time, resume when appropriate  ? ?Best Practice (right click and "Reselect all SmartList Selections" daily)  ? ?Diet/type: NPO w/ meds via tube ?DVT prophylaxis: systemic heparin ?GI prophylaxis: H2B ?Lines: Central line, Arterial Line, and yes and it is still needed ?Foley:   N/A ?Code Status:  full code ?Last date of multidisciplinary goals of care discussion: See Plan of Care Note written 3/27 ? ? ?Critical care time: 39 minutes  ? ?Redmond School., MSN, APRN, AGACNP-BC ?[facility]  ?10-28-21 , 7:42 AM ? ?Please see [email or web address] for pager details ? ?If no response, please call [phone] ?After hours, please call Elink at [phone] ? ? ? ? ? ? ? ?

## 2021-10-29 NOTE — Progress Notes (Signed)
Pharmacy Antibiotic Note ? ?Jeffrey Goodwin is a 56 y.o. male admitted on 10/24/2021 with  MRSA bacteremia .  Pharmacy has been consulted for vancomycin dosing.Noted patient had some clinical worsening and has now been transferred to the ICU. SCr continues to worsen today and is now up to 1.99 with CrCl ~ 50 ml/min. Vanc trough ordered this morning was still within normal limits at 15.  ? ?  ?Plan: ?Will reduce Vancomycin to 1 gm every 12 hours due to worsening renal function  ?Continue to monitor renal function, clinical status ?Noted MRI and TEE pending today ?If MRI negative for septic emboli can consider changing to Daptomycin + Doxy for MRSA bacteremia/lung coverage per ID  ? ?Height: 5\' 10"  (177.8 cm) ?Weight: 114.2 kg (251 lb 12.3 oz) ?IBW/kg (Calculated) : 73 ? ?Temp (24hrs), Avg:101.9 ?F (38.8 ?C), Min:99.5 ?F (37.5 ?C), Max:103.4 ?F (39.7 ?C) ? ?Recent Labs  ?Lab 10/15/2021 ?1901 10/19/21 ?0637 10/20/21 ?10/22/21 10/20/21 ?1132 09/30/2021 ?10/23/21 10/22/21 ?0303 10/23/21 ?0006 10/23/21 ?10/17/2021 10/24/21 ?0401 11/12/21 ?10/27/21 November 12, 2021 ?10/27/21  ?WBC  --  19.0* 19.7*  --  18.9* 18.5* 24.0*  --  28.3* 28.9*  --   ?CREATININE  --  1.06 0.94  --  1.15 1.12 1.23  --  1.42* 1.99*  --   ?LATICACIDVEN 3.2* 2.3* 2.1* 2.1*  --   --  1.9  --   --   --   --   ?VANCOTROUGH  --   --   --   --   --   --   --  12*  --   --  15  ?VANCOPEAK  --   --   --   --   --   --  28*  --   --   --   --   ? ?  ?Estimated Creatinine Clearance: 51.8 mL/min (A) (by C-G formula based on SCr of 1.99 mg/dL (H)).   ? ?No Active Allergies ? ?Antibiotics this admission: ?Cefepime 3/21 >> 3/22 ?Flagyl 3/21 >> 3/22 ?Vancomycin 3/21 >> ? ?Dose adjustments: ?Increased vanc from 1500 mg IV q24hr to 1250 mg IV q12hr ?VP 28, VT 12 - calc AUC ~500 - continue 1250mg  IV q12h) ?3/28>Worsening renal function; VT-15. Empirically decrease dose to 1 gm q 12 hours  ? ?Microbiology results: ?3/21 BCx: MRSA 1/2  ?3/26 BCx: MRSA 2/2 ?3/27 BCx: Pending  ?3/27 respiratory  culture: pending  ? ?Thank you for allowing pharmacy to participate in this patient's care. ? ?4/27, PharmD, BCPS, BCIDP ?Infectious Diseases Clinical Pharmacist ?Phone: [phone] ?Please see amion for complete clinical pharmacist phone list ?Nov 12, 2021 9:01 AM ? ? ? ? ?

## 2021-10-29 NOTE — Significant Event (Signed)
Care Contact Phone Call ? ?Called Jatin Naumann Mancuso's daughter, Elizer Bostic, at [phone]. They were updated on Kimber Esterly Starliper's clinical status and plan. ? ?Gershon Mussel., MSN, APRN, AGACNP-BC ?[facility]  ?Oct 27, 2021 , 10:11 AM ? ?Please see [email or web address] for pager details ? ?If no response, please call [phone] ?After hours, please call Elink at [phone] ? ?

## 2021-10-29 NOTE — Progress Notes (Signed)
Pt transported to MRI and back to 2M05. ?

## 2021-10-29 NOTE — Progress Notes (Signed)
PT Cancellation/Discharge Note ? ?Patient Details ?Name: Jeffrey Goodwin ?MRN: [id number] ?DOB: 25-Aug-1965 ? ? ?Cancelled Treatment:    Reason Eval/Treat Not Completed: Other (comment). Pt with medical decline and now for comfort care. ? ? ?Shary Decamp [facility] ?23-Nov-2021, 12:44 PM ?Suanne Marker PT ?Acute Rehabilitation Services ?Pager [phone] ?Office [phone] ? ?

## 2021-10-29 DEATH — deceased

## 2022-09-14 IMAGING — MR MR HEAD W/O CM
6 of 10 series · 28 of 48 positions shown · non-contrast
Comparison: CT head October 18, 2021.
COMPARISON: CT head October 18, 2021.

Addendum:
CLINICAL DATA: Neuro deficit, acute, stroke suspected

Tech note: 55-year-old male with COPD, DVT/PE, hypertension who is
here with septic shock due to MRSA bacteremia
EXAM:
MRI HEAD WITHOUT CONTRAST
TECHNIQUE: Multiplanar, multiecho pulse sequences of the brain and surrounding
structures were obtained without intravenous contrast.

[Series 2: DWI · axial · 3.0mm · 0.94mm/px · z∈[-78,+56]mm · 9 of 92 slices shown (1 of 2)]
[im 1/92]
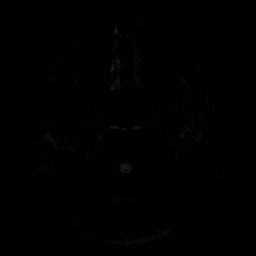
[im 12/92]
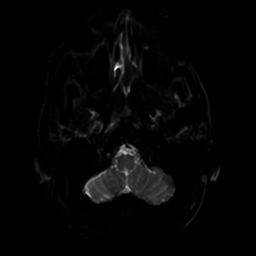
[im 23/92]
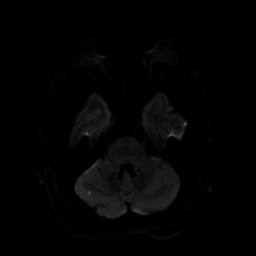
[im 35/92]
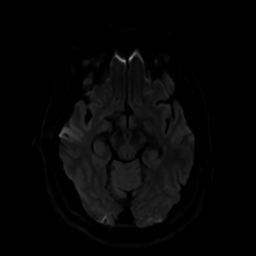
[im 46/92]
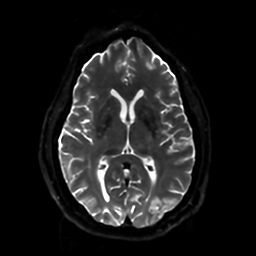
[im 57/92]
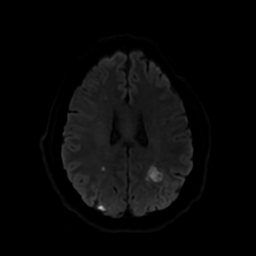
[im 69/92]
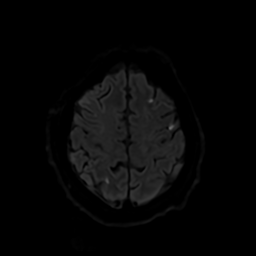
[im 80/92]
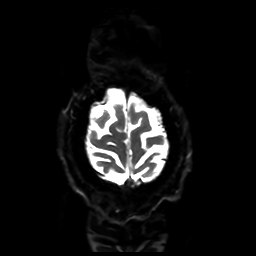
[im 92/92]
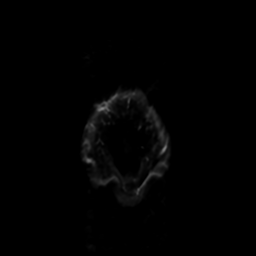

[Series 3: DWI · coronal · 4.0mm · 0.94mm/px · 7 of 71 slices shown (2 of 2)]
[im 1/71]
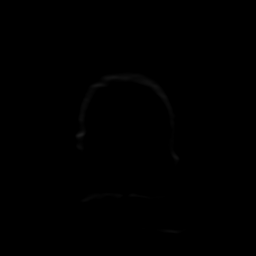
[im 12/71]
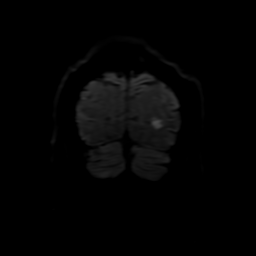
[im 24/71]
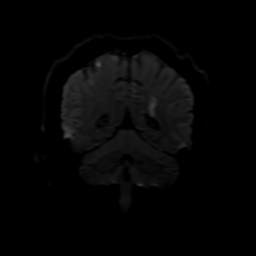
[im 36/71]
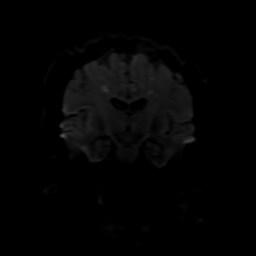
[im 47/71]
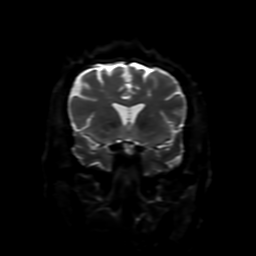
[im 59/71]
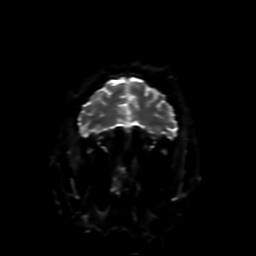
[im 71/71]
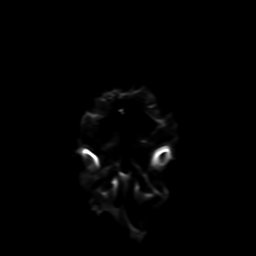

[Series 4: FLAIR · sagittal · 5.0mm · 0.23mm/px · 2 of 24 slices shown (1 of 2)]
[im 1/24]
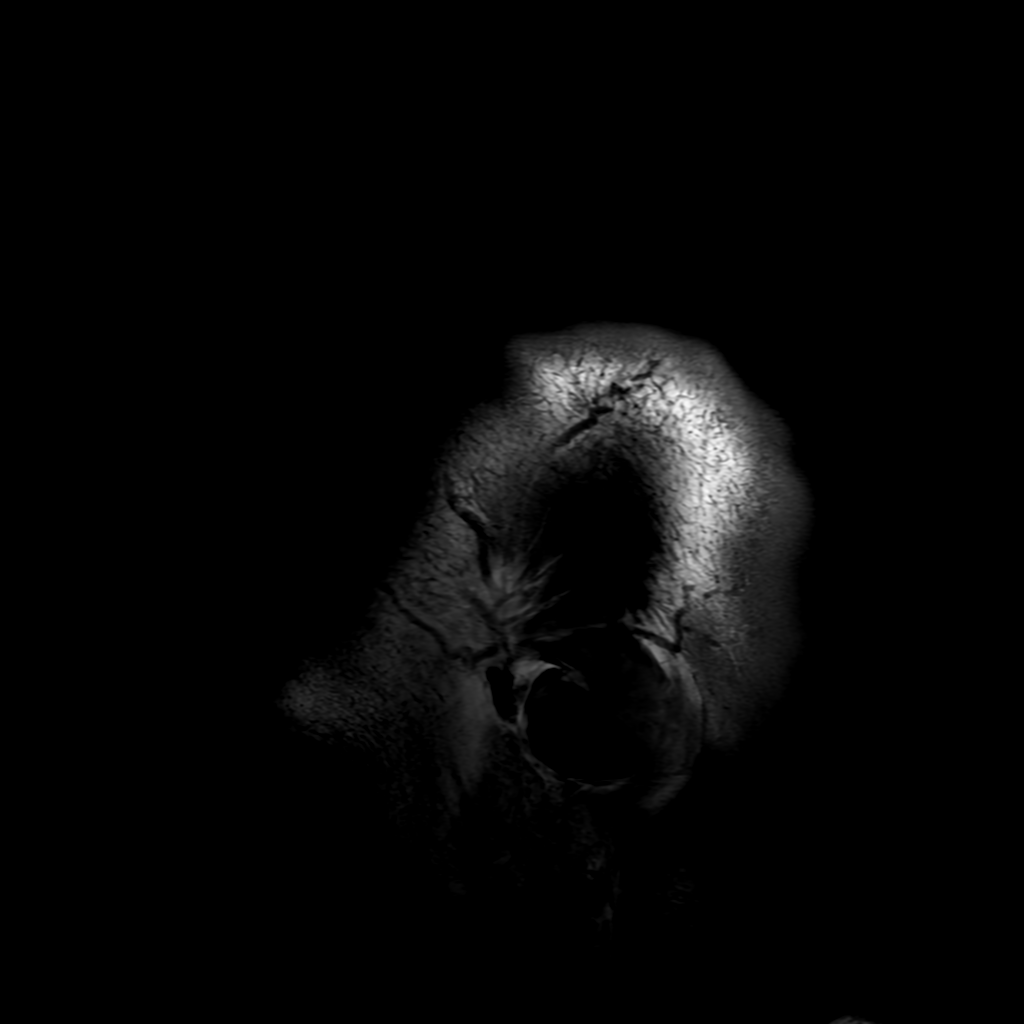
[im 24/24]
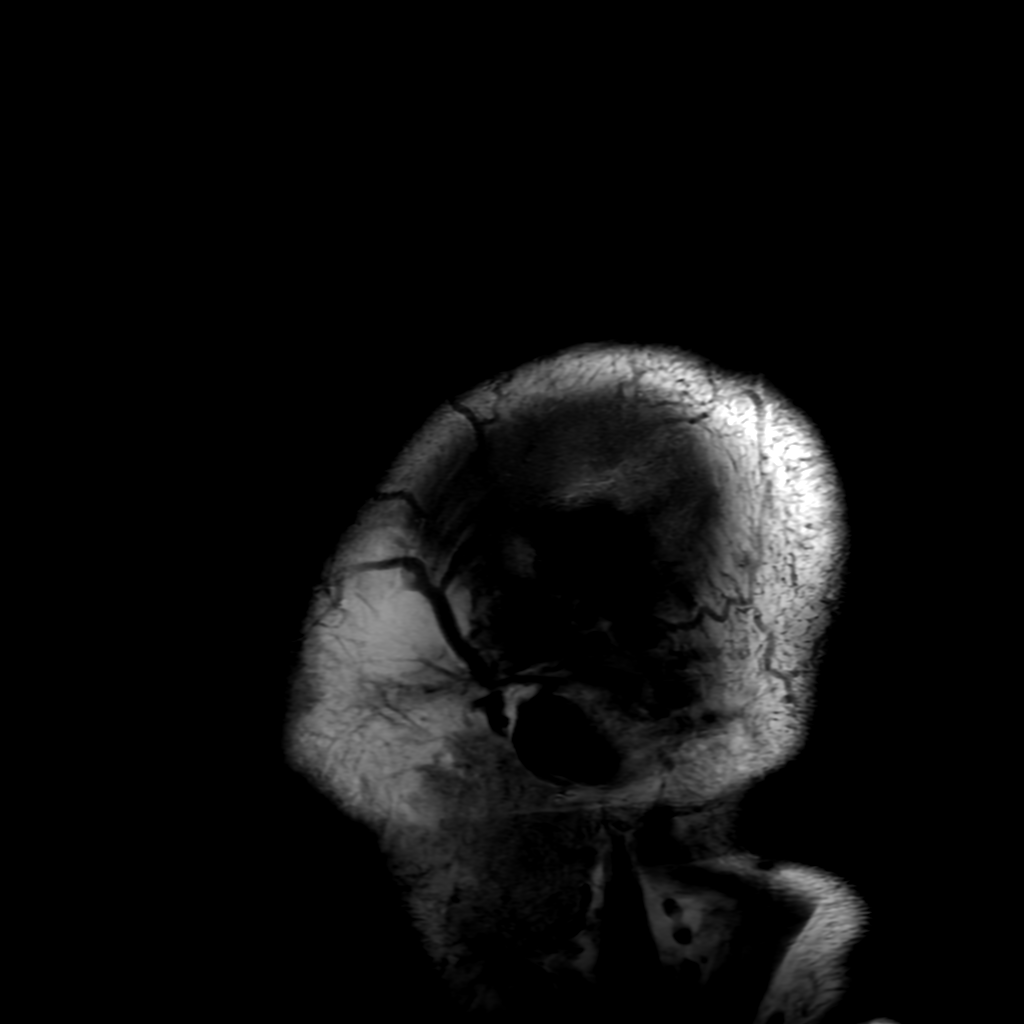

[Series 6: FLAIR · axial · 4.0mm · 0.45mm/px · z∈[-86,+58]mm · 3 of 34 slices shown (2 of 2)]
[im 1/34]
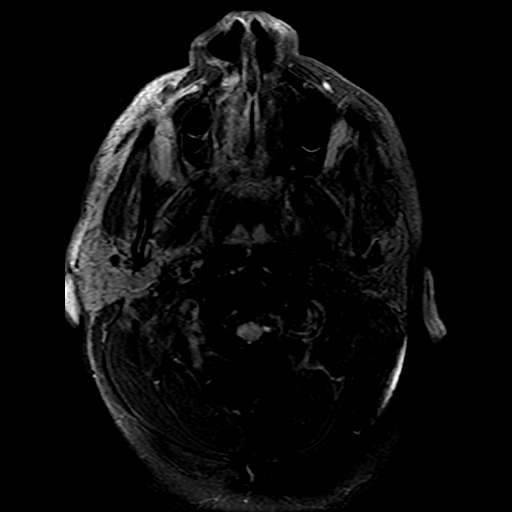
[im 17/34]
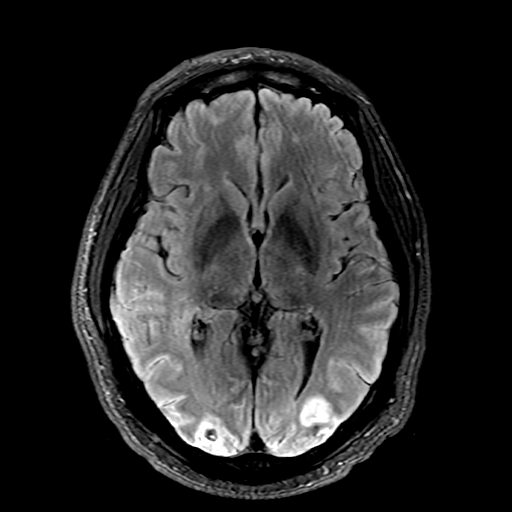
[im 34/34]
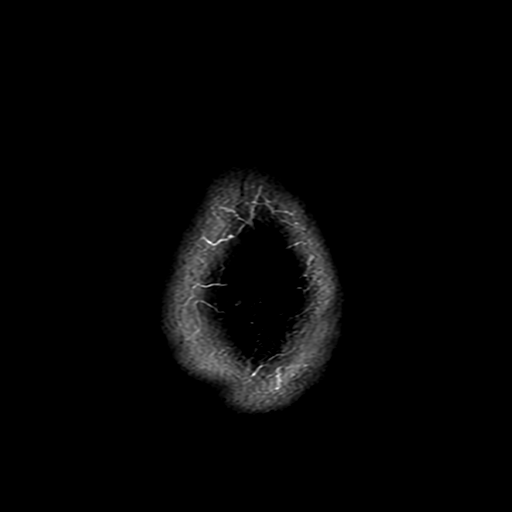

[Series 250: ADC · axial · 3.0mm · 0.94mm/px · z∈[-78,+56]mm · 4 of 46 slices shown (1 of 2)]
[im 1/46]
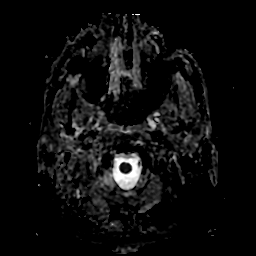
[im 16/46]
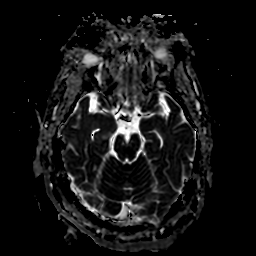
[im 31/46]
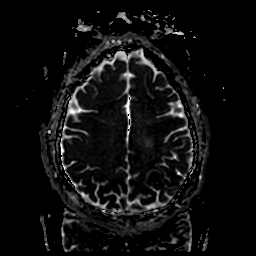
[im 46/46]
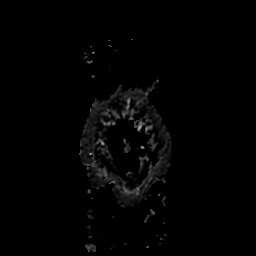

[Series 350: ADC · coronal · 4.0mm · 0.94mm/px · 3 of 35 slices shown (2 of 2)]
[im 1/35]
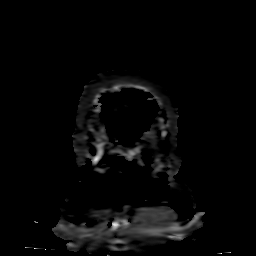
[im 18/35]
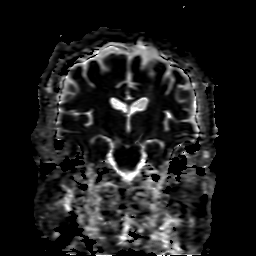
[im 35/35]
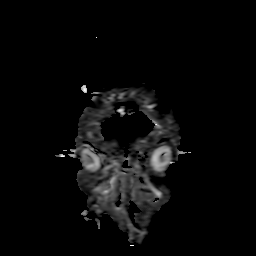

[28 of 48 positions shown; findings below may reference images not displayed]

FINDINGS: Brain: There are many foci of restricted diffusion throughout the
left frontal, parietal and occipital lobes, and to a lesser extent
bilateral cerebellar hemispheres which are compatible with acute
infarcts. Some of these infarcts demonstrate areas susceptibility
artifact and some T2 hypointensity, compatible hemorrhage. There is
associated edema, greatest in the left parietal lobe. No significant
mass effect or midline shift. No hydrocephalus.

Vascular: Major arterial flow voids are maintained skull base

Skull and upper cervical spine: Normal marrow signal.

Sinuses/Orbits: Mild paranasal sinus mucosal thickening. No acute
orbital findings.

Other: No mastoid effusions.
IMPRESSION: Findings compatible with many supratentorial and infratentorial
acute infarcts bilaterally involving multiple vascular territories
with areas of associated hemorrhage, detailed above. Given the
patient's clinical history, a septic-embolic process is a
consideration. Associated edema without significant mass effect.

These results will be called to the ordering clinician or
representative by the Radiologist Assistant, and communication
documented in the PACS or [REDACTED].

ADDENDUM:
Correction to the findings: Areas of restricted diffusion are
throughout BILATERAL frontal, parietal and occipital lobes (not just
the left).

*** End of Addendum ***
FINDINGS: Brain: There are many foci of restricted diffusion throughout the
left frontal, parietal and occipital lobes, and to a lesser extent
bilateral cerebellar hemispheres which are compatible with acute
infarcts. Some of these infarcts demonstrate areas susceptibility
artifact and some T2 hypointensity, compatible hemorrhage. There is
associated edema, greatest in the left parietal lobe. No significant
mass effect or midline shift. No hydrocephalus.

Vascular: Major arterial flow voids are maintained skull base

Skull and upper cervical spine: Normal marrow signal.

Sinuses/Orbits: Mild paranasal sinus mucosal thickening. No acute
orbital findings.

Other: No mastoid effusions.
IMPRESSION: Findings compatible with many supratentorial and infratentorial
acute infarcts bilaterally involving multiple vascular territories
with areas of associated hemorrhage, detailed above. Given the
patient's clinical history, a septic-embolic process is a
consideration. Associated edema without significant mass effect.

These results will be called to the ordering clinician or
representative by the Radiologist Assistant, and communication
documented in the PACS or [REDACTED].
